# Patient Record
Sex: Male | Born: 1940 | Race: White | Hispanic: No | Marital: Married | Smoking: Current every day smoker
Health system: Southern US, Community
[De-identification: ages and names within clinical notes are randomized; demographics above are authoritative.]

## PROBLEM LIST (undated history)

## (undated) DIAGNOSIS — R001 Bradycardia, unspecified: Secondary | ICD-10-CM

## (undated) DIAGNOSIS — M545 Low back pain, unspecified: Secondary | ICD-10-CM

## (undated) DIAGNOSIS — M1990 Unspecified osteoarthritis, unspecified site: Secondary | ICD-10-CM

## (undated) DIAGNOSIS — K219 Gastro-esophageal reflux disease without esophagitis: Secondary | ICD-10-CM

## (undated) DIAGNOSIS — E785 Hyperlipidemia, unspecified: Secondary | ICD-10-CM

## (undated) DIAGNOSIS — N400 Enlarged prostate without lower urinary tract symptoms: Secondary | ICD-10-CM

## (undated) DIAGNOSIS — G8929 Other chronic pain: Secondary | ICD-10-CM

## (undated) DIAGNOSIS — Z87442 Personal history of urinary calculi: Secondary | ICD-10-CM

## (undated) DIAGNOSIS — Z87438 Personal history of other diseases of male genital organs: Secondary | ICD-10-CM

## (undated) HISTORY — DX: Hyperlipidemia, unspecified: E78.5

## (undated) HISTORY — DX: Gastro-esophageal reflux disease without esophagitis: K21.9

## (undated) HISTORY — DX: Personal history of other diseases of male genital organs: Z87.438

---

## 1987-04-28 HISTORY — PX: CYSTOSCOPY W/ STONE MANIPULATION: SHX1427

## 2008-12-18 ENCOUNTER — Ambulatory Visit (HOSPITAL_COMMUNITY): Admission: RE | Admit: 2008-12-18 | Discharge: 2008-12-18 | Payer: Self-pay | Admitting: *Deleted

## 2011-05-07 DIAGNOSIS — E782 Mixed hyperlipidemia: Secondary | ICD-10-CM | POA: Diagnosis not present

## 2011-05-07 DIAGNOSIS — K219 Gastro-esophageal reflux disease without esophagitis: Secondary | ICD-10-CM | POA: Diagnosis not present

## 2011-05-12 DIAGNOSIS — Z23 Encounter for immunization: Secondary | ICD-10-CM | POA: Diagnosis not present

## 2011-05-19 DIAGNOSIS — R972 Elevated prostate specific antigen [PSA]: Secondary | ICD-10-CM | POA: Diagnosis not present

## 2011-08-01 DIAGNOSIS — Z79899 Other long term (current) drug therapy: Secondary | ICD-10-CM | POA: Diagnosis not present

## 2011-08-01 DIAGNOSIS — F17200 Nicotine dependence, unspecified, uncomplicated: Secondary | ICD-10-CM | POA: Diagnosis not present

## 2011-08-01 DIAGNOSIS — R509 Fever, unspecified: Secondary | ICD-10-CM | POA: Diagnosis not present

## 2011-08-01 DIAGNOSIS — M549 Dorsalgia, unspecified: Secondary | ICD-10-CM | POA: Diagnosis not present

## 2011-08-01 DIAGNOSIS — N390 Urinary tract infection, site not specified: Secondary | ICD-10-CM | POA: Diagnosis not present

## 2011-08-01 DIAGNOSIS — R42 Dizziness and giddiness: Secondary | ICD-10-CM | POA: Diagnosis not present

## 2011-08-06 DIAGNOSIS — N410 Acute prostatitis: Secondary | ICD-10-CM | POA: Diagnosis not present

## 2011-11-11 DIAGNOSIS — R972 Elevated prostate specific antigen [PSA]: Secondary | ICD-10-CM | POA: Diagnosis not present

## 2011-11-16 DIAGNOSIS — N400 Enlarged prostate without lower urinary tract symptoms: Secondary | ICD-10-CM | POA: Diagnosis not present

## 2011-12-21 DIAGNOSIS — Z125 Encounter for screening for malignant neoplasm of prostate: Secondary | ICD-10-CM | POA: Diagnosis not present

## 2011-12-21 DIAGNOSIS — N400 Enlarged prostate without lower urinary tract symptoms: Secondary | ICD-10-CM | POA: Diagnosis not present

## 2011-12-21 DIAGNOSIS — E782 Mixed hyperlipidemia: Secondary | ICD-10-CM | POA: Diagnosis not present

## 2011-12-29 DIAGNOSIS — N400 Enlarged prostate without lower urinary tract symptoms: Secondary | ICD-10-CM | POA: Diagnosis not present

## 2011-12-29 DIAGNOSIS — R972 Elevated prostate specific antigen [PSA]: Secondary | ICD-10-CM | POA: Diagnosis not present

## 2012-01-26 DIAGNOSIS — R972 Elevated prostate specific antigen [PSA]: Secondary | ICD-10-CM | POA: Diagnosis not present

## 2012-01-29 DIAGNOSIS — M25569 Pain in unspecified knee: Secondary | ICD-10-CM | POA: Diagnosis not present

## 2012-02-01 DIAGNOSIS — N400 Enlarged prostate without lower urinary tract symptoms: Secondary | ICD-10-CM | POA: Diagnosis not present

## 2012-02-01 DIAGNOSIS — N529 Male erectile dysfunction, unspecified: Secondary | ICD-10-CM | POA: Diagnosis not present

## 2012-02-01 DIAGNOSIS — R972 Elevated prostate specific antigen [PSA]: Secondary | ICD-10-CM | POA: Diagnosis not present

## 2012-03-11 DIAGNOSIS — M25569 Pain in unspecified knee: Secondary | ICD-10-CM | POA: Diagnosis not present

## 2012-04-29 DIAGNOSIS — M25519 Pain in unspecified shoulder: Secondary | ICD-10-CM | POA: Diagnosis not present

## 2012-05-04 DIAGNOSIS — M75100 Unspecified rotator cuff tear or rupture of unspecified shoulder, not specified as traumatic: Secondary | ICD-10-CM | POA: Diagnosis not present

## 2012-05-04 DIAGNOSIS — M6250 Muscle wasting and atrophy, not elsewhere classified, unspecified site: Secondary | ICD-10-CM | POA: Diagnosis not present

## 2012-05-19 DIAGNOSIS — E782 Mixed hyperlipidemia: Secondary | ICD-10-CM | POA: Diagnosis not present

## 2012-06-29 DIAGNOSIS — N411 Chronic prostatitis: Secondary | ICD-10-CM | POA: Diagnosis not present

## 2012-06-29 DIAGNOSIS — M5126 Other intervertebral disc displacement, lumbar region: Secondary | ICD-10-CM | POA: Diagnosis not present

## 2012-06-29 DIAGNOSIS — R972 Elevated prostate specific antigen [PSA]: Secondary | ICD-10-CM | POA: Diagnosis not present

## 2012-06-29 DIAGNOSIS — R351 Nocturia: Secondary | ICD-10-CM | POA: Diagnosis not present

## 2012-06-29 DIAGNOSIS — M48061 Spinal stenosis, lumbar region without neurogenic claudication: Secondary | ICD-10-CM | POA: Diagnosis not present

## 2012-06-29 DIAGNOSIS — R3915 Urgency of urination: Secondary | ICD-10-CM | POA: Diagnosis not present

## 2012-07-12 DIAGNOSIS — M47817 Spondylosis without myelopathy or radiculopathy, lumbosacral region: Secondary | ICD-10-CM | POA: Diagnosis not present

## 2012-07-12 DIAGNOSIS — IMO0002 Reserved for concepts with insufficient information to code with codable children: Secondary | ICD-10-CM | POA: Diagnosis not present

## 2012-07-15 DIAGNOSIS — M5126 Other intervertebral disc displacement, lumbar region: Secondary | ICD-10-CM | POA: Diagnosis not present

## 2012-07-15 DIAGNOSIS — IMO0002 Reserved for concepts with insufficient information to code with codable children: Secondary | ICD-10-CM | POA: Diagnosis not present

## 2012-07-29 DIAGNOSIS — R972 Elevated prostate specific antigen [PSA]: Secondary | ICD-10-CM | POA: Diagnosis not present

## 2012-07-29 DIAGNOSIS — N411 Chronic prostatitis: Secondary | ICD-10-CM | POA: Diagnosis not present

## 2012-07-29 DIAGNOSIS — R3915 Urgency of urination: Secondary | ICD-10-CM | POA: Diagnosis not present

## 2012-08-01 DIAGNOSIS — R3915 Urgency of urination: Secondary | ICD-10-CM | POA: Diagnosis not present

## 2012-08-01 DIAGNOSIS — R351 Nocturia: Secondary | ICD-10-CM | POA: Diagnosis not present

## 2012-08-01 DIAGNOSIS — R972 Elevated prostate specific antigen [PSA]: Secondary | ICD-10-CM | POA: Diagnosis not present

## 2012-08-01 DIAGNOSIS — N411 Chronic prostatitis: Secondary | ICD-10-CM | POA: Diagnosis not present

## 2012-08-02 DIAGNOSIS — M5126 Other intervertebral disc displacement, lumbar region: Secondary | ICD-10-CM | POA: Diagnosis not present

## 2012-08-02 DIAGNOSIS — IMO0002 Reserved for concepts with insufficient information to code with codable children: Secondary | ICD-10-CM | POA: Diagnosis not present

## 2012-08-05 DIAGNOSIS — IMO0002 Reserved for concepts with insufficient information to code with codable children: Secondary | ICD-10-CM | POA: Diagnosis not present

## 2012-08-05 DIAGNOSIS — M5126 Other intervertebral disc displacement, lumbar region: Secondary | ICD-10-CM | POA: Diagnosis not present

## 2012-08-18 DIAGNOSIS — Z0000 Encounter for general adult medical examination without abnormal findings: Secondary | ICD-10-CM | POA: Diagnosis not present

## 2012-08-18 DIAGNOSIS — M549 Dorsalgia, unspecified: Secondary | ICD-10-CM | POA: Diagnosis not present

## 2012-08-25 DIAGNOSIS — R972 Elevated prostate specific antigen [PSA]: Secondary | ICD-10-CM | POA: Diagnosis not present

## 2012-08-30 DIAGNOSIS — IMO0002 Reserved for concepts with insufficient information to code with codable children: Secondary | ICD-10-CM | POA: Diagnosis not present

## 2012-08-30 DIAGNOSIS — M5126 Other intervertebral disc displacement, lumbar region: Secondary | ICD-10-CM | POA: Diagnosis not present

## 2012-08-31 DIAGNOSIS — N411 Chronic prostatitis: Secondary | ICD-10-CM | POA: Diagnosis not present

## 2012-08-31 DIAGNOSIS — R972 Elevated prostate specific antigen [PSA]: Secondary | ICD-10-CM | POA: Diagnosis not present

## 2012-08-31 DIAGNOSIS — R3915 Urgency of urination: Secondary | ICD-10-CM | POA: Diagnosis not present

## 2012-08-31 DIAGNOSIS — R351 Nocturia: Secondary | ICD-10-CM | POA: Diagnosis not present

## 2012-09-02 DIAGNOSIS — M79609 Pain in unspecified limb: Secondary | ICD-10-CM | POA: Diagnosis not present

## 2012-09-02 DIAGNOSIS — M5126 Other intervertebral disc displacement, lumbar region: Secondary | ICD-10-CM | POA: Diagnosis not present

## 2012-09-02 DIAGNOSIS — M2560 Stiffness of unspecified joint, not elsewhere classified: Secondary | ICD-10-CM | POA: Diagnosis not present

## 2012-09-02 DIAGNOSIS — M6250 Muscle wasting and atrophy, not elsewhere classified, unspecified site: Secondary | ICD-10-CM | POA: Diagnosis not present

## 2012-09-05 DIAGNOSIS — M79609 Pain in unspecified limb: Secondary | ICD-10-CM | POA: Diagnosis not present

## 2012-09-05 DIAGNOSIS — M6250 Muscle wasting and atrophy, not elsewhere classified, unspecified site: Secondary | ICD-10-CM | POA: Diagnosis not present

## 2012-09-05 DIAGNOSIS — M5126 Other intervertebral disc displacement, lumbar region: Secondary | ICD-10-CM | POA: Diagnosis not present

## 2012-09-05 DIAGNOSIS — M2560 Stiffness of unspecified joint, not elsewhere classified: Secondary | ICD-10-CM | POA: Diagnosis not present

## 2012-09-08 DIAGNOSIS — M5126 Other intervertebral disc displacement, lumbar region: Secondary | ICD-10-CM | POA: Diagnosis not present

## 2012-09-08 DIAGNOSIS — M2560 Stiffness of unspecified joint, not elsewhere classified: Secondary | ICD-10-CM | POA: Diagnosis not present

## 2012-09-08 DIAGNOSIS — M6250 Muscle wasting and atrophy, not elsewhere classified, unspecified site: Secondary | ICD-10-CM | POA: Diagnosis not present

## 2012-09-08 DIAGNOSIS — M79609 Pain in unspecified limb: Secondary | ICD-10-CM | POA: Diagnosis not present

## 2012-09-16 DIAGNOSIS — M5126 Other intervertebral disc displacement, lumbar region: Secondary | ICD-10-CM | POA: Diagnosis not present

## 2012-09-16 DIAGNOSIS — M79609 Pain in unspecified limb: Secondary | ICD-10-CM | POA: Diagnosis not present

## 2012-09-16 DIAGNOSIS — M6250 Muscle wasting and atrophy, not elsewhere classified, unspecified site: Secondary | ICD-10-CM | POA: Diagnosis not present

## 2012-09-16 DIAGNOSIS — M2560 Stiffness of unspecified joint, not elsewhere classified: Secondary | ICD-10-CM | POA: Diagnosis not present

## 2012-09-23 DIAGNOSIS — M2560 Stiffness of unspecified joint, not elsewhere classified: Secondary | ICD-10-CM | POA: Diagnosis not present

## 2012-09-23 DIAGNOSIS — M79609 Pain in unspecified limb: Secondary | ICD-10-CM | POA: Diagnosis not present

## 2012-09-23 DIAGNOSIS — M5126 Other intervertebral disc displacement, lumbar region: Secondary | ICD-10-CM | POA: Diagnosis not present

## 2012-09-23 DIAGNOSIS — M6250 Muscle wasting and atrophy, not elsewhere classified, unspecified site: Secondary | ICD-10-CM | POA: Diagnosis not present

## 2012-09-26 DIAGNOSIS — M6250 Muscle wasting and atrophy, not elsewhere classified, unspecified site: Secondary | ICD-10-CM | POA: Diagnosis not present

## 2012-09-26 DIAGNOSIS — M2560 Stiffness of unspecified joint, not elsewhere classified: Secondary | ICD-10-CM | POA: Diagnosis not present

## 2012-09-26 DIAGNOSIS — M79609 Pain in unspecified limb: Secondary | ICD-10-CM | POA: Diagnosis not present

## 2012-09-26 DIAGNOSIS — M5126 Other intervertebral disc displacement, lumbar region: Secondary | ICD-10-CM | POA: Diagnosis not present

## 2012-09-29 DIAGNOSIS — M6250 Muscle wasting and atrophy, not elsewhere classified, unspecified site: Secondary | ICD-10-CM | POA: Diagnosis not present

## 2012-09-29 DIAGNOSIS — M5126 Other intervertebral disc displacement, lumbar region: Secondary | ICD-10-CM | POA: Diagnosis not present

## 2012-09-29 DIAGNOSIS — M79609 Pain in unspecified limb: Secondary | ICD-10-CM | POA: Diagnosis not present

## 2012-09-29 DIAGNOSIS — M2560 Stiffness of unspecified joint, not elsewhere classified: Secondary | ICD-10-CM | POA: Diagnosis not present

## 2012-10-03 DIAGNOSIS — M2560 Stiffness of unspecified joint, not elsewhere classified: Secondary | ICD-10-CM | POA: Diagnosis not present

## 2012-10-03 DIAGNOSIS — M6250 Muscle wasting and atrophy, not elsewhere classified, unspecified site: Secondary | ICD-10-CM | POA: Diagnosis not present

## 2012-10-03 DIAGNOSIS — M79609 Pain in unspecified limb: Secondary | ICD-10-CM | POA: Diagnosis not present

## 2012-10-03 DIAGNOSIS — M5126 Other intervertebral disc displacement, lumbar region: Secondary | ICD-10-CM | POA: Diagnosis not present

## 2012-10-06 DIAGNOSIS — M6250 Muscle wasting and atrophy, not elsewhere classified, unspecified site: Secondary | ICD-10-CM | POA: Diagnosis not present

## 2012-10-06 DIAGNOSIS — M2560 Stiffness of unspecified joint, not elsewhere classified: Secondary | ICD-10-CM | POA: Diagnosis not present

## 2012-10-06 DIAGNOSIS — M5126 Other intervertebral disc displacement, lumbar region: Secondary | ICD-10-CM | POA: Diagnosis not present

## 2012-10-06 DIAGNOSIS — M79609 Pain in unspecified limb: Secondary | ICD-10-CM | POA: Diagnosis not present

## 2012-10-10 DIAGNOSIS — M2560 Stiffness of unspecified joint, not elsewhere classified: Secondary | ICD-10-CM | POA: Diagnosis not present

## 2012-10-10 DIAGNOSIS — M6250 Muscle wasting and atrophy, not elsewhere classified, unspecified site: Secondary | ICD-10-CM | POA: Diagnosis not present

## 2012-10-10 DIAGNOSIS — M79609 Pain in unspecified limb: Secondary | ICD-10-CM | POA: Diagnosis not present

## 2012-10-10 DIAGNOSIS — M5126 Other intervertebral disc displacement, lumbar region: Secondary | ICD-10-CM | POA: Diagnosis not present

## 2012-10-11 DIAGNOSIS — M47817 Spondylosis without myelopathy or radiculopathy, lumbosacral region: Secondary | ICD-10-CM | POA: Diagnosis not present

## 2012-10-11 DIAGNOSIS — M5126 Other intervertebral disc displacement, lumbar region: Secondary | ICD-10-CM | POA: Diagnosis not present

## 2012-10-14 DIAGNOSIS — M2560 Stiffness of unspecified joint, not elsewhere classified: Secondary | ICD-10-CM | POA: Diagnosis not present

## 2012-10-14 DIAGNOSIS — M6250 Muscle wasting and atrophy, not elsewhere classified, unspecified site: Secondary | ICD-10-CM | POA: Diagnosis not present

## 2012-10-14 DIAGNOSIS — M79609 Pain in unspecified limb: Secondary | ICD-10-CM | POA: Diagnosis not present

## 2012-10-14 DIAGNOSIS — M5126 Other intervertebral disc displacement, lumbar region: Secondary | ICD-10-CM | POA: Diagnosis not present

## 2012-10-17 DIAGNOSIS — M5126 Other intervertebral disc displacement, lumbar region: Secondary | ICD-10-CM | POA: Diagnosis not present

## 2012-10-17 DIAGNOSIS — M6250 Muscle wasting and atrophy, not elsewhere classified, unspecified site: Secondary | ICD-10-CM | POA: Diagnosis not present

## 2012-10-17 DIAGNOSIS — M79609 Pain in unspecified limb: Secondary | ICD-10-CM | POA: Diagnosis not present

## 2012-10-17 DIAGNOSIS — M2560 Stiffness of unspecified joint, not elsewhere classified: Secondary | ICD-10-CM | POA: Diagnosis not present

## 2012-10-21 DIAGNOSIS — M79609 Pain in unspecified limb: Secondary | ICD-10-CM | POA: Diagnosis not present

## 2012-10-21 DIAGNOSIS — M2560 Stiffness of unspecified joint, not elsewhere classified: Secondary | ICD-10-CM | POA: Diagnosis not present

## 2012-10-21 DIAGNOSIS — M6250 Muscle wasting and atrophy, not elsewhere classified, unspecified site: Secondary | ICD-10-CM | POA: Diagnosis not present

## 2012-10-21 DIAGNOSIS — M5126 Other intervertebral disc displacement, lumbar region: Secondary | ICD-10-CM | POA: Diagnosis not present

## 2012-10-24 DIAGNOSIS — M2560 Stiffness of unspecified joint, not elsewhere classified: Secondary | ICD-10-CM | POA: Diagnosis not present

## 2012-10-24 DIAGNOSIS — M6250 Muscle wasting and atrophy, not elsewhere classified, unspecified site: Secondary | ICD-10-CM | POA: Diagnosis not present

## 2012-10-24 DIAGNOSIS — M79609 Pain in unspecified limb: Secondary | ICD-10-CM | POA: Diagnosis not present

## 2012-10-24 DIAGNOSIS — M5126 Other intervertebral disc displacement, lumbar region: Secondary | ICD-10-CM | POA: Diagnosis not present

## 2012-10-27 DIAGNOSIS — M6250 Muscle wasting and atrophy, not elsewhere classified, unspecified site: Secondary | ICD-10-CM | POA: Diagnosis not present

## 2012-10-27 DIAGNOSIS — M2560 Stiffness of unspecified joint, not elsewhere classified: Secondary | ICD-10-CM | POA: Diagnosis not present

## 2012-10-27 DIAGNOSIS — M5126 Other intervertebral disc displacement, lumbar region: Secondary | ICD-10-CM | POA: Diagnosis not present

## 2012-10-27 DIAGNOSIS — M79609 Pain in unspecified limb: Secondary | ICD-10-CM | POA: Diagnosis not present

## 2012-10-31 DIAGNOSIS — M79609 Pain in unspecified limb: Secondary | ICD-10-CM | POA: Diagnosis not present

## 2012-10-31 DIAGNOSIS — M2560 Stiffness of unspecified joint, not elsewhere classified: Secondary | ICD-10-CM | POA: Diagnosis not present

## 2012-10-31 DIAGNOSIS — M5126 Other intervertebral disc displacement, lumbar region: Secondary | ICD-10-CM | POA: Diagnosis not present

## 2012-10-31 DIAGNOSIS — M6250 Muscle wasting and atrophy, not elsewhere classified, unspecified site: Secondary | ICD-10-CM | POA: Diagnosis not present

## 2012-11-07 DIAGNOSIS — M5126 Other intervertebral disc displacement, lumbar region: Secondary | ICD-10-CM | POA: Diagnosis not present

## 2012-11-07 DIAGNOSIS — M2560 Stiffness of unspecified joint, not elsewhere classified: Secondary | ICD-10-CM | POA: Diagnosis not present

## 2012-11-07 DIAGNOSIS — M6250 Muscle wasting and atrophy, not elsewhere classified, unspecified site: Secondary | ICD-10-CM | POA: Diagnosis not present

## 2012-11-07 DIAGNOSIS — M79609 Pain in unspecified limb: Secondary | ICD-10-CM | POA: Diagnosis not present

## 2012-11-10 DIAGNOSIS — M6250 Muscle wasting and atrophy, not elsewhere classified, unspecified site: Secondary | ICD-10-CM | POA: Diagnosis not present

## 2012-11-10 DIAGNOSIS — M5126 Other intervertebral disc displacement, lumbar region: Secondary | ICD-10-CM | POA: Diagnosis not present

## 2012-11-10 DIAGNOSIS — M2560 Stiffness of unspecified joint, not elsewhere classified: Secondary | ICD-10-CM | POA: Diagnosis not present

## 2012-11-10 DIAGNOSIS — M79609 Pain in unspecified limb: Secondary | ICD-10-CM | POA: Diagnosis not present

## 2012-11-14 DIAGNOSIS — M2560 Stiffness of unspecified joint, not elsewhere classified: Secondary | ICD-10-CM | POA: Diagnosis not present

## 2012-11-14 DIAGNOSIS — M5126 Other intervertebral disc displacement, lumbar region: Secondary | ICD-10-CM | POA: Diagnosis not present

## 2012-11-14 DIAGNOSIS — M79609 Pain in unspecified limb: Secondary | ICD-10-CM | POA: Diagnosis not present

## 2012-11-14 DIAGNOSIS — M6250 Muscle wasting and atrophy, not elsewhere classified, unspecified site: Secondary | ICD-10-CM | POA: Diagnosis not present

## 2012-11-15 DIAGNOSIS — M5126 Other intervertebral disc displacement, lumbar region: Secondary | ICD-10-CM | POA: Diagnosis not present

## 2012-11-15 DIAGNOSIS — IMO0002 Reserved for concepts with insufficient information to code with codable children: Secondary | ICD-10-CM | POA: Diagnosis not present

## 2012-11-17 DIAGNOSIS — E782 Mixed hyperlipidemia: Secondary | ICD-10-CM | POA: Diagnosis not present

## 2012-11-18 DIAGNOSIS — M6250 Muscle wasting and atrophy, not elsewhere classified, unspecified site: Secondary | ICD-10-CM | POA: Diagnosis not present

## 2012-11-18 DIAGNOSIS — M5126 Other intervertebral disc displacement, lumbar region: Secondary | ICD-10-CM | POA: Diagnosis not present

## 2012-11-18 DIAGNOSIS — M2560 Stiffness of unspecified joint, not elsewhere classified: Secondary | ICD-10-CM | POA: Diagnosis not present

## 2012-11-18 DIAGNOSIS — M79609 Pain in unspecified limb: Secondary | ICD-10-CM | POA: Diagnosis not present

## 2013-01-31 DIAGNOSIS — R972 Elevated prostate specific antigen [PSA]: Secondary | ICD-10-CM | POA: Diagnosis not present

## 2013-02-07 DIAGNOSIS — R972 Elevated prostate specific antigen [PSA]: Secondary | ICD-10-CM | POA: Diagnosis not present

## 2013-02-07 DIAGNOSIS — R3915 Urgency of urination: Secondary | ICD-10-CM | POA: Diagnosis not present

## 2013-02-07 DIAGNOSIS — N411 Chronic prostatitis: Secondary | ICD-10-CM | POA: Diagnosis not present

## 2013-02-07 DIAGNOSIS — R351 Nocturia: Secondary | ICD-10-CM | POA: Diagnosis not present

## 2013-02-17 DIAGNOSIS — E782 Mixed hyperlipidemia: Secondary | ICD-10-CM | POA: Diagnosis not present

## 2013-02-23 DIAGNOSIS — Z23 Encounter for immunization: Secondary | ICD-10-CM | POA: Diagnosis not present

## 2013-06-20 DIAGNOSIS — Z79899 Other long term (current) drug therapy: Secondary | ICD-10-CM | POA: Diagnosis not present

## 2013-06-20 DIAGNOSIS — E782 Mixed hyperlipidemia: Secondary | ICD-10-CM | POA: Diagnosis not present

## 2013-08-03 DIAGNOSIS — R972 Elevated prostate specific antigen [PSA]: Secondary | ICD-10-CM | POA: Diagnosis not present

## 2013-08-09 DIAGNOSIS — R3915 Urgency of urination: Secondary | ICD-10-CM | POA: Diagnosis not present

## 2013-08-09 DIAGNOSIS — R39198 Other difficulties with micturition: Secondary | ICD-10-CM | POA: Diagnosis not present

## 2013-08-09 DIAGNOSIS — R972 Elevated prostate specific antigen [PSA]: Secondary | ICD-10-CM | POA: Diagnosis not present

## 2013-09-19 DIAGNOSIS — E782 Mixed hyperlipidemia: Secondary | ICD-10-CM | POA: Diagnosis not present

## 2013-09-19 DIAGNOSIS — Z0000 Encounter for general adult medical examination without abnormal findings: Secondary | ICD-10-CM | POA: Diagnosis not present

## 2013-12-19 DIAGNOSIS — E782 Mixed hyperlipidemia: Secondary | ICD-10-CM | POA: Diagnosis not present

## 2014-01-04 DIAGNOSIS — E782 Mixed hyperlipidemia: Secondary | ICD-10-CM | POA: Diagnosis not present

## 2014-02-06 DIAGNOSIS — R972 Elevated prostate specific antigen [PSA]: Secondary | ICD-10-CM | POA: Diagnosis not present

## 2014-02-12 DIAGNOSIS — Z87438 Personal history of other diseases of male genital organs: Secondary | ICD-10-CM | POA: Diagnosis not present

## 2014-02-12 DIAGNOSIS — N529 Male erectile dysfunction, unspecified: Secondary | ICD-10-CM | POA: Diagnosis not present

## 2014-02-12 DIAGNOSIS — R3919 Other difficulties with micturition: Secondary | ICD-10-CM | POA: Diagnosis not present

## 2014-02-12 DIAGNOSIS — R972 Elevated prostate specific antigen [PSA]: Secondary | ICD-10-CM | POA: Diagnosis not present

## 2014-02-16 DIAGNOSIS — Z23 Encounter for immunization: Secondary | ICD-10-CM | POA: Diagnosis not present

## 2014-03-13 DIAGNOSIS — R972 Elevated prostate specific antigen [PSA]: Secondary | ICD-10-CM | POA: Diagnosis not present

## 2014-03-19 DIAGNOSIS — E784 Other hyperlipidemia: Secondary | ICD-10-CM | POA: Diagnosis not present

## 2014-03-19 DIAGNOSIS — N401 Enlarged prostate with lower urinary tract symptoms: Secondary | ICD-10-CM | POA: Diagnosis not present

## 2014-03-19 DIAGNOSIS — R3919 Other difficulties with micturition: Secondary | ICD-10-CM | POA: Diagnosis not present

## 2014-03-19 DIAGNOSIS — R972 Elevated prostate specific antigen [PSA]: Secondary | ICD-10-CM | POA: Diagnosis not present

## 2014-03-19 DIAGNOSIS — Z87438 Personal history of other diseases of male genital organs: Secondary | ICD-10-CM | POA: Diagnosis not present

## 2014-05-07 DIAGNOSIS — H2513 Age-related nuclear cataract, bilateral: Secondary | ICD-10-CM | POA: Diagnosis not present

## 2014-05-07 DIAGNOSIS — H35371 Puckering of macula, right eye: Secondary | ICD-10-CM | POA: Diagnosis not present

## 2014-05-07 DIAGNOSIS — Z720 Tobacco use: Secondary | ICD-10-CM | POA: Diagnosis not present

## 2014-05-31 DIAGNOSIS — H35371 Puckering of macula, right eye: Secondary | ICD-10-CM | POA: Diagnosis not present

## 2014-06-21 DIAGNOSIS — E784 Other hyperlipidemia: Secondary | ICD-10-CM | POA: Diagnosis not present

## 2014-06-21 DIAGNOSIS — E782 Mixed hyperlipidemia: Secondary | ICD-10-CM | POA: Diagnosis not present

## 2014-08-30 DIAGNOSIS — H35371 Puckering of macula, right eye: Secondary | ICD-10-CM | POA: Diagnosis not present

## 2014-09-14 DIAGNOSIS — R972 Elevated prostate specific antigen [PSA]: Secondary | ICD-10-CM | POA: Diagnosis not present

## 2014-09-25 DIAGNOSIS — R972 Elevated prostate specific antigen [PSA]: Secondary | ICD-10-CM | POA: Diagnosis not present

## 2014-09-25 DIAGNOSIS — Z87438 Personal history of other diseases of male genital organs: Secondary | ICD-10-CM | POA: Diagnosis not present

## 2014-10-01 DIAGNOSIS — E784 Other hyperlipidemia: Secondary | ICD-10-CM | POA: Diagnosis not present

## 2014-10-01 DIAGNOSIS — Z0000 Encounter for general adult medical examination without abnormal findings: Secondary | ICD-10-CM | POA: Diagnosis not present

## 2014-10-01 DIAGNOSIS — M549 Dorsalgia, unspecified: Secondary | ICD-10-CM | POA: Diagnosis not present

## 2014-10-01 DIAGNOSIS — K210 Gastro-esophageal reflux disease with esophagitis: Secondary | ICD-10-CM | POA: Diagnosis not present

## 2014-10-01 DIAGNOSIS — M8588 Other specified disorders of bone density and structure, other site: Secondary | ICD-10-CM | POA: Diagnosis not present

## 2014-10-01 DIAGNOSIS — R05 Cough: Secondary | ICD-10-CM | POA: Diagnosis not present

## 2014-10-01 DIAGNOSIS — M47814 Spondylosis without myelopathy or radiculopathy, thoracic region: Secondary | ICD-10-CM | POA: Diagnosis not present

## 2014-10-01 DIAGNOSIS — Z1389 Encounter for screening for other disorder: Secondary | ICD-10-CM | POA: Diagnosis not present

## 2014-10-01 DIAGNOSIS — F17200 Nicotine dependence, unspecified, uncomplicated: Secondary | ICD-10-CM | POA: Diagnosis not present

## 2014-10-26 DIAGNOSIS — Z87438 Personal history of other diseases of male genital organs: Secondary | ICD-10-CM

## 2014-10-26 HISTORY — PX: PROSTATE BIOPSY: SHX241

## 2014-10-26 HISTORY — DX: Personal history of other diseases of male genital organs: Z87.438

## 2014-11-06 DIAGNOSIS — E869 Volume depletion, unspecified: Secondary | ICD-10-CM | POA: Diagnosis present

## 2014-11-06 DIAGNOSIS — B9620 Unspecified Escherichia coli [E. coli] as the cause of diseases classified elsewhere: Secondary | ICD-10-CM | POA: Diagnosis present

## 2014-11-06 DIAGNOSIS — R972 Elevated prostate specific antigen [PSA]: Secondary | ICD-10-CM | POA: Diagnosis not present

## 2014-11-06 DIAGNOSIS — N390 Urinary tract infection, site not specified: Secondary | ICD-10-CM | POA: Diagnosis not present

## 2014-11-06 DIAGNOSIS — A4151 Sepsis due to Escherichia coli [E. coli]: Secondary | ICD-10-CM | POA: Diagnosis not present

## 2014-11-06 DIAGNOSIS — K219 Gastro-esophageal reflux disease without esophagitis: Secondary | ICD-10-CM | POA: Diagnosis not present

## 2014-11-06 DIAGNOSIS — N410 Acute prostatitis: Secondary | ICD-10-CM | POA: Diagnosis not present

## 2014-11-06 DIAGNOSIS — F17200 Nicotine dependence, unspecified, uncomplicated: Secondary | ICD-10-CM | POA: Diagnosis not present

## 2014-11-06 DIAGNOSIS — D291 Benign neoplasm of prostate: Secondary | ICD-10-CM | POA: Diagnosis not present

## 2014-11-06 DIAGNOSIS — R0602 Shortness of breath: Secondary | ICD-10-CM | POA: Diagnosis not present

## 2014-11-06 DIAGNOSIS — D638 Anemia in other chronic diseases classified elsewhere: Secondary | ICD-10-CM | POA: Diagnosis not present

## 2014-11-06 DIAGNOSIS — E860 Dehydration: Secondary | ICD-10-CM | POA: Diagnosis not present

## 2014-11-06 DIAGNOSIS — N182 Chronic kidney disease, stage 2 (mild): Secondary | ICD-10-CM | POA: Diagnosis present

## 2014-11-06 DIAGNOSIS — N289 Disorder of kidney and ureter, unspecified: Secondary | ICD-10-CM | POA: Diagnosis present

## 2014-11-06 DIAGNOSIS — E780 Pure hypercholesterolemia: Secondary | ICD-10-CM | POA: Diagnosis present

## 2014-11-06 DIAGNOSIS — E785 Hyperlipidemia, unspecified: Secondary | ICD-10-CM | POA: Diagnosis not present

## 2014-11-06 DIAGNOSIS — M549 Dorsalgia, unspecified: Secondary | ICD-10-CM | POA: Diagnosis not present

## 2014-11-08 DIAGNOSIS — N410 Acute prostatitis: Secondary | ICD-10-CM | POA: Diagnosis not present

## 2014-11-08 DIAGNOSIS — M549 Dorsalgia, unspecified: Secondary | ICD-10-CM | POA: Diagnosis not present

## 2014-11-08 DIAGNOSIS — N390 Urinary tract infection, site not specified: Secondary | ICD-10-CM | POA: Diagnosis not present

## 2014-11-13 DIAGNOSIS — N179 Acute kidney failure, unspecified: Secondary | ICD-10-CM | POA: Diagnosis not present

## 2014-11-13 DIAGNOSIS — N400 Enlarged prostate without lower urinary tract symptoms: Secondary | ICD-10-CM | POA: Diagnosis not present

## 2014-11-13 DIAGNOSIS — N390 Urinary tract infection, site not specified: Secondary | ICD-10-CM | POA: Diagnosis not present

## 2014-11-13 DIAGNOSIS — F17210 Nicotine dependence, cigarettes, uncomplicated: Secondary | ICD-10-CM | POA: Diagnosis not present

## 2014-11-13 DIAGNOSIS — N410 Acute prostatitis: Secondary | ICD-10-CM | POA: Diagnosis not present

## 2014-11-14 DIAGNOSIS — F17210 Nicotine dependence, cigarettes, uncomplicated: Secondary | ICD-10-CM | POA: Diagnosis not present

## 2014-11-14 DIAGNOSIS — N179 Acute kidney failure, unspecified: Secondary | ICD-10-CM | POA: Diagnosis not present

## 2014-11-14 DIAGNOSIS — N390 Urinary tract infection, site not specified: Secondary | ICD-10-CM | POA: Diagnosis not present

## 2014-11-14 DIAGNOSIS — N400 Enlarged prostate without lower urinary tract symptoms: Secondary | ICD-10-CM | POA: Diagnosis not present

## 2014-11-14 DIAGNOSIS — N410 Acute prostatitis: Secondary | ICD-10-CM | POA: Diagnosis not present

## 2014-11-15 DIAGNOSIS — N390 Urinary tract infection, site not specified: Secondary | ICD-10-CM | POA: Diagnosis not present

## 2014-11-15 DIAGNOSIS — N410 Acute prostatitis: Secondary | ICD-10-CM | POA: Diagnosis not present

## 2014-11-15 DIAGNOSIS — F17210 Nicotine dependence, cigarettes, uncomplicated: Secondary | ICD-10-CM | POA: Diagnosis not present

## 2014-11-15 DIAGNOSIS — N179 Acute kidney failure, unspecified: Secondary | ICD-10-CM | POA: Diagnosis not present

## 2014-11-15 DIAGNOSIS — N400 Enlarged prostate without lower urinary tract symptoms: Secondary | ICD-10-CM | POA: Diagnosis not present

## 2014-11-16 DIAGNOSIS — N179 Acute kidney failure, unspecified: Secondary | ICD-10-CM | POA: Diagnosis not present

## 2014-11-16 DIAGNOSIS — N410 Acute prostatitis: Secondary | ICD-10-CM | POA: Diagnosis not present

## 2014-11-16 DIAGNOSIS — N390 Urinary tract infection, site not specified: Secondary | ICD-10-CM | POA: Diagnosis not present

## 2014-11-16 DIAGNOSIS — N400 Enlarged prostate without lower urinary tract symptoms: Secondary | ICD-10-CM | POA: Diagnosis not present

## 2014-11-16 DIAGNOSIS — F17210 Nicotine dependence, cigarettes, uncomplicated: Secondary | ICD-10-CM | POA: Diagnosis not present

## 2014-11-17 DIAGNOSIS — N410 Acute prostatitis: Secondary | ICD-10-CM | POA: Diagnosis not present

## 2014-11-17 DIAGNOSIS — N179 Acute kidney failure, unspecified: Secondary | ICD-10-CM | POA: Diagnosis not present

## 2014-11-17 DIAGNOSIS — F17210 Nicotine dependence, cigarettes, uncomplicated: Secondary | ICD-10-CM | POA: Diagnosis not present

## 2014-11-17 DIAGNOSIS — N390 Urinary tract infection, site not specified: Secondary | ICD-10-CM | POA: Diagnosis not present

## 2014-11-17 DIAGNOSIS — N400 Enlarged prostate without lower urinary tract symptoms: Secondary | ICD-10-CM | POA: Diagnosis not present

## 2014-11-18 DIAGNOSIS — F17210 Nicotine dependence, cigarettes, uncomplicated: Secondary | ICD-10-CM | POA: Diagnosis not present

## 2014-11-18 DIAGNOSIS — N390 Urinary tract infection, site not specified: Secondary | ICD-10-CM | POA: Diagnosis not present

## 2014-11-18 DIAGNOSIS — N400 Enlarged prostate without lower urinary tract symptoms: Secondary | ICD-10-CM | POA: Diagnosis not present

## 2014-11-18 DIAGNOSIS — N179 Acute kidney failure, unspecified: Secondary | ICD-10-CM | POA: Diagnosis not present

## 2014-11-18 DIAGNOSIS — N410 Acute prostatitis: Secondary | ICD-10-CM | POA: Diagnosis not present

## 2014-11-19 DIAGNOSIS — N410 Acute prostatitis: Secondary | ICD-10-CM | POA: Diagnosis not present

## 2014-11-19 DIAGNOSIS — N179 Acute kidney failure, unspecified: Secondary | ICD-10-CM | POA: Diagnosis not present

## 2014-11-19 DIAGNOSIS — N400 Enlarged prostate without lower urinary tract symptoms: Secondary | ICD-10-CM | POA: Diagnosis not present

## 2014-11-19 DIAGNOSIS — R001 Bradycardia, unspecified: Secondary | ICD-10-CM | POA: Diagnosis not present

## 2014-11-19 DIAGNOSIS — N390 Urinary tract infection, site not specified: Secondary | ICD-10-CM | POA: Diagnosis not present

## 2014-11-19 DIAGNOSIS — F17210 Nicotine dependence, cigarettes, uncomplicated: Secondary | ICD-10-CM | POA: Diagnosis not present

## 2014-11-20 DIAGNOSIS — N400 Enlarged prostate without lower urinary tract symptoms: Secondary | ICD-10-CM | POA: Diagnosis not present

## 2014-11-20 DIAGNOSIS — N390 Urinary tract infection, site not specified: Secondary | ICD-10-CM | POA: Diagnosis not present

## 2014-11-20 DIAGNOSIS — N179 Acute kidney failure, unspecified: Secondary | ICD-10-CM | POA: Diagnosis not present

## 2014-11-20 DIAGNOSIS — N410 Acute prostatitis: Secondary | ICD-10-CM | POA: Diagnosis not present

## 2014-11-20 DIAGNOSIS — F17210 Nicotine dependence, cigarettes, uncomplicated: Secondary | ICD-10-CM | POA: Diagnosis not present

## 2014-11-21 DIAGNOSIS — F17210 Nicotine dependence, cigarettes, uncomplicated: Secondary | ICD-10-CM | POA: Diagnosis not present

## 2014-11-21 DIAGNOSIS — N410 Acute prostatitis: Secondary | ICD-10-CM | POA: Diagnosis not present

## 2014-11-21 DIAGNOSIS — N400 Enlarged prostate without lower urinary tract symptoms: Secondary | ICD-10-CM | POA: Diagnosis not present

## 2014-11-21 DIAGNOSIS — N390 Urinary tract infection, site not specified: Secondary | ICD-10-CM | POA: Diagnosis not present

## 2014-11-21 DIAGNOSIS — N179 Acute kidney failure, unspecified: Secondary | ICD-10-CM | POA: Diagnosis not present

## 2014-11-22 DIAGNOSIS — N410 Acute prostatitis: Secondary | ICD-10-CM | POA: Diagnosis not present

## 2014-11-22 DIAGNOSIS — N390 Urinary tract infection, site not specified: Secondary | ICD-10-CM | POA: Diagnosis not present

## 2014-11-22 DIAGNOSIS — N179 Acute kidney failure, unspecified: Secondary | ICD-10-CM | POA: Diagnosis not present

## 2014-11-22 DIAGNOSIS — F17210 Nicotine dependence, cigarettes, uncomplicated: Secondary | ICD-10-CM | POA: Diagnosis not present

## 2014-11-22 DIAGNOSIS — N400 Enlarged prostate without lower urinary tract symptoms: Secondary | ICD-10-CM | POA: Diagnosis not present

## 2014-11-26 ENCOUNTER — Encounter: Payer: Self-pay | Admitting: *Deleted

## 2014-11-27 ENCOUNTER — Encounter: Payer: Self-pay | Admitting: Cardiology

## 2014-11-27 ENCOUNTER — Ambulatory Visit (INDEPENDENT_AMBULATORY_CARE_PROVIDER_SITE_OTHER): Payer: Medicare Other | Admitting: Cardiology

## 2014-11-27 ENCOUNTER — Encounter: Payer: Self-pay | Admitting: *Deleted

## 2014-11-27 ENCOUNTER — Ambulatory Visit (INDEPENDENT_AMBULATORY_CARE_PROVIDER_SITE_OTHER): Payer: Medicare Other

## 2014-11-27 VITALS — BP 104/61 | HR 59 | Ht 74.0 in | Wt 198.0 lb

## 2014-11-27 DIAGNOSIS — E785 Hyperlipidemia, unspecified: Secondary | ICD-10-CM

## 2014-11-27 DIAGNOSIS — R001 Bradycardia, unspecified: Secondary | ICD-10-CM

## 2014-11-27 NOTE — Patient Instructions (Signed)
Your physician recommends that you schedule a follow-up appointment AFTER MONITOR RESULTS  Your physician recommends that you continue on your current medications as directed. Please refer to the Current Medication list given to you today.  Your physician has recommended that you wear a holter monitor 48 HOURS . Holter monitors are medical devices that record the heart's electrical activity. Doctors most often use these monitors to diagnose arrhythmias. Arrhythmias are problems with the speed or rhythm of the heartbeat. The monitor is a small, portable device. You can wear one while you do your normal daily activities. This is usually used to diagnose what is causing palpitations/syncope (passing out).  Thank you for choosing [facility]!!

## 2014-11-27 NOTE — Progress Notes (Signed)
Cardiology Office Note  Date: 11/27/2014   ID: Ronald May, DOB 24-Sep-1940, MRN [id number]  PCP: Neale Burly, MD  Consulting Cardiologist: Rozann Lesches, MD   Chief Complaint  Patient presents with  . Bradycardia    History of Present Illness: Ronald May is a 74 y.o. male referred for cardiology consultation by Dr. Sherrie Sport. He presents for further evaluation of bradycardia. He states that he has been told that his heart rate is "slow" over the last several months, although it is not entirely clear that this is symptom provoking. He denies having any sudden onset of syncope. Sometimes feels mildly lightheaded when he stands up quickly, but this is not a progressive or recurring problem. He remains active, works in his garden, also restores furniture, specifically repairs chair cane bottoms and backings.  Record review finds hospitalization at [facility] in July with diagnoses including acute prostatitis, dehydration, and acute on chronic renal insufficiency. He was hydrated also treated with antibiotic therapy.  He reports that his blood pressure has always been normal, sometimes low normal. He is not on any antihypertensives medications or heart rate lowering medications.  ECG is reviewed showing sinus bradycardia at 53 bpm with normal intervals and decreased R waves in the anteroseptal leads. He has no history of ischemic heart disease, myocardial infarction, or cardiac arrhythmia.   Past Medical History  Diagnosis Date  . History of prostatitis   . GERD (gastroesophageal reflux disease)   . Hyperlipidemia     Past Surgical History  Procedure Laterality Date  . Kidney stone extraction      Current Outpatient Prescriptions  Medication Sig Dispense Refill  . alfuzosin (UROXATRAL) 10 MG 24 hr tablet Take 10 mg by mouth daily with breakfast.    . calcium-vitamin D (OSCAL WITH D) 500-200 MG-UNIT per tablet Take 1 tablet by mouth daily.     . nitrofurantoin (MACRODANTIN)  100 MG capsule Take 100 mg by mouth 2 (two) times daily.    . Omega-3 Fatty Acids (FISH OIL) 1000 MG CAPS Take 1 capsule by mouth 3 (three) times daily.     Marland Kitchen omeprazole (PRILOSEC) 20 MG capsule Take 20 mg by mouth daily.    . ranitidine (ZANTAC) 300 MG tablet Take 300 mg by mouth 2 (two) times daily.    . simvastatin (ZOCOR) 40 MG tablet Take 40 mg by mouth daily.     No current facility-administered medications for this visit.    Allergies:  Review of patient's allergies indicates no known allergies.   Social History: The patient  reports that he has quit smoking. His smoking use included Cigarettes. He started smoking about 4 weeks ago. He does not have any smokeless tobacco history on file. He reports that he does not drink alcohol or use illicit drugs.   Family History: The patient's family history includes CAD in his brother; Liver cancer in his mother.   ROS:  Please see the history of present illness. Otherwise, complete review of systems is positive for arthritis in his hands.  All other systems are reviewed and negative.   Physical Exam: VS:  BP 104/61 mmHg  Pulse 59  Ht 6\' 2"  (1.88 m)  Wt 198 lb (89.812 kg)  BMI 25.41 kg/m2  SpO2 92%, BMI Body mass index is 25.41 kg/(m^2).  Wt Readings from Last 3 Encounters:  11/27/14 198 lb (89.812 kg)  11/19/14 199 lb (90.266 kg)     General: Patient appears comfortable at rest. HEENT: Conjunctiva and  lids normal, oropharynx clear. Neck: Supple, no elevated JVP or carotid bruits, no thyromegaly. Lungs: Clear to auscultation, nonlabored breathing at rest. Cardiac: Regular rate and rhythm, no S3, soft systolic murmur, no pericardial rub. Abdomen: Soft, nontender, bowel sounds present, no guarding or rebound. Extremities: No pitting edema, distal pulses 2+. Skin: Warm and dry. Musculoskeletal: No kyphosis. Neuropsychiatric: Alert and oriented x3, affect grossly appropriate.   ECG: Tracing from 11/08/2014 [facility]) showed sinus  bradycardia at 50 bpm.   Other Studies Reviewed Today:  Chest x-ray 11/10/2014 [facility]) reported bronchitic changes with increased density in the left retrocardiac region.  Assessment and Plan:  1. Sinus bradycardia, not entirely clear that this is symptom provoking. He does not endorse any syncope or major functional limitation. Occasionally has mild lightheadedness when he stands quickly. Suspect that that might be more related to blood pressure than heart rate however. Recommend 48 hour Holter monitor to better evaluate heart rate variability.  2. History of hyperlipidemia, on Zocor and omega-3 supplements. Followed by Dr. Sherrie Sport.  Current medicines were reviewed with the patient today.   Orders Placed This Encounter  Procedures  . Holter monitor - 48 hour  . EKG 12-Lead    Disposition: Call with test results.   Signed, Satira Sark, MD, [facility] 11/27/2014 9:59 AM    [facility], [place], [place] [address] Phone: [phone]; Fax: [phone]

## 2014-12-05 ENCOUNTER — Telehealth: Payer: Self-pay | Admitting: *Deleted

## 2014-12-05 NOTE — Telephone Encounter (Signed)
-----   Message from Satira Sark, MD sent at 12/05/2014  8:07 AM EDT ----- See report. Limited bradycardia noted but no pauses or significant arrhythmia. Could consider evaluation for sleep apnea per Dr. Sherrie Sport since early AM bradycardia noted, but no further cardiac workup for now.

## 2014-12-06 NOTE — Telephone Encounter (Signed)
Patient informed and copy sent to Dr. Sherrie Sport.

## 2015-02-19 DIAGNOSIS — Z23 Encounter for immunization: Secondary | ICD-10-CM | POA: Diagnosis not present

## 2015-02-21 DIAGNOSIS — E784 Other hyperlipidemia: Secondary | ICD-10-CM | POA: Diagnosis not present

## 2015-02-21 DIAGNOSIS — K210 Gastro-esophageal reflux disease with esophagitis: Secondary | ICD-10-CM | POA: Diagnosis not present

## 2015-02-21 DIAGNOSIS — J440 Chronic obstructive pulmonary disease with acute lower respiratory infection: Secondary | ICD-10-CM | POA: Diagnosis not present

## 2015-02-21 DIAGNOSIS — E161 Other hypoglycemia: Secondary | ICD-10-CM | POA: Diagnosis not present

## 2015-02-21 DIAGNOSIS — R008 Other abnormalities of heart beat: Secondary | ICD-10-CM | POA: Diagnosis not present

## 2015-02-28 DIAGNOSIS — H35383 Toxic maculopathy, bilateral: Secondary | ICD-10-CM | POA: Diagnosis not present

## 2015-05-20 DIAGNOSIS — R972 Elevated prostate specific antigen [PSA]: Secondary | ICD-10-CM | POA: Diagnosis not present

## 2015-05-28 DIAGNOSIS — Z87438 Personal history of other diseases of male genital organs: Secondary | ICD-10-CM | POA: Diagnosis not present

## 2015-05-28 DIAGNOSIS — R972 Elevated prostate specific antigen [PSA]: Secondary | ICD-10-CM | POA: Diagnosis not present

## 2015-05-28 DIAGNOSIS — E161 Other hypoglycemia: Secondary | ICD-10-CM | POA: Diagnosis not present

## 2015-05-28 DIAGNOSIS — K210 Gastro-esophageal reflux disease with esophagitis: Secondary | ICD-10-CM | POA: Diagnosis not present

## 2015-05-28 DIAGNOSIS — L570 Actinic keratosis: Secondary | ICD-10-CM | POA: Diagnosis not present

## 2015-05-28 DIAGNOSIS — E784 Other hyperlipidemia: Secondary | ICD-10-CM | POA: Diagnosis not present

## 2015-07-01 DIAGNOSIS — R972 Elevated prostate specific antigen [PSA]: Secondary | ICD-10-CM | POA: Diagnosis not present

## 2015-07-08 DIAGNOSIS — R972 Elevated prostate specific antigen [PSA]: Secondary | ICD-10-CM | POA: Diagnosis not present

## 2015-09-02 DIAGNOSIS — K210 Gastro-esophageal reflux disease with esophagitis: Secondary | ICD-10-CM | POA: Diagnosis not present

## 2015-09-02 DIAGNOSIS — E161 Other hypoglycemia: Secondary | ICD-10-CM | POA: Diagnosis not present

## 2015-09-02 DIAGNOSIS — E784 Other hyperlipidemia: Secondary | ICD-10-CM | POA: Diagnosis not present

## 2015-10-10 DIAGNOSIS — K210 Gastro-esophageal reflux disease with esophagitis: Secondary | ICD-10-CM | POA: Diagnosis not present

## 2015-10-10 DIAGNOSIS — E784 Other hyperlipidemia: Secondary | ICD-10-CM | POA: Diagnosis not present

## 2015-11-15 DIAGNOSIS — E784 Other hyperlipidemia: Secondary | ICD-10-CM | POA: Diagnosis not present

## 2015-11-15 DIAGNOSIS — K210 Gastro-esophageal reflux disease with esophagitis: Secondary | ICD-10-CM | POA: Diagnosis not present

## 2015-12-09 DIAGNOSIS — Z0000 Encounter for general adult medical examination without abnormal findings: Secondary | ICD-10-CM | POA: Diagnosis not present

## 2015-12-09 DIAGNOSIS — R008 Other abnormalities of heart beat: Secondary | ICD-10-CM | POA: Diagnosis not present

## 2015-12-09 DIAGNOSIS — E784 Other hyperlipidemia: Secondary | ICD-10-CM | POA: Diagnosis not present

## 2015-12-09 DIAGNOSIS — E161 Other hypoglycemia: Secondary | ICD-10-CM | POA: Diagnosis not present

## 2015-12-09 DIAGNOSIS — K210 Gastro-esophageal reflux disease with esophagitis: Secondary | ICD-10-CM | POA: Diagnosis not present

## 2015-12-09 DIAGNOSIS — Z1389 Encounter for screening for other disorder: Secondary | ICD-10-CM | POA: Diagnosis not present

## 2015-12-12 DIAGNOSIS — Z23 Encounter for immunization: Secondary | ICD-10-CM | POA: Diagnosis not present

## 2015-12-24 ENCOUNTER — Encounter: Payer: Self-pay | Admitting: "Endocrinology

## 2015-12-24 DIAGNOSIS — Z136 Encounter for screening for cardiovascular disorders: Secondary | ICD-10-CM | POA: Diagnosis not present

## 2015-12-24 DIAGNOSIS — K210 Gastro-esophageal reflux disease with esophagitis: Secondary | ICD-10-CM | POA: Diagnosis not present

## 2015-12-24 DIAGNOSIS — E161 Other hypoglycemia: Secondary | ICD-10-CM | POA: Diagnosis not present

## 2015-12-24 DIAGNOSIS — R008 Other abnormalities of heart beat: Secondary | ICD-10-CM | POA: Diagnosis not present

## 2015-12-24 DIAGNOSIS — E784 Other hyperlipidemia: Secondary | ICD-10-CM | POA: Diagnosis not present

## 2015-12-24 DIAGNOSIS — Z87891 Personal history of nicotine dependence: Secondary | ICD-10-CM | POA: Diagnosis not present

## 2015-12-24 DIAGNOSIS — Z8249 Family history of ischemic heart disease and other diseases of the circulatory system: Secondary | ICD-10-CM | POA: Diagnosis not present

## 2015-12-24 DIAGNOSIS — Z8489 Family history of other specified conditions: Secondary | ICD-10-CM | POA: Diagnosis not present

## 2016-01-08 DIAGNOSIS — Z23 Encounter for immunization: Secondary | ICD-10-CM | POA: Diagnosis not present

## 2016-01-10 DIAGNOSIS — R972 Elevated prostate specific antigen [PSA]: Secondary | ICD-10-CM | POA: Diagnosis not present

## 2016-01-14 ENCOUNTER — Ambulatory Visit (INDEPENDENT_AMBULATORY_CARE_PROVIDER_SITE_OTHER): Payer: Medicare Other | Admitting: "Endocrinology

## 2016-01-14 ENCOUNTER — Encounter: Payer: Self-pay | Admitting: "Endocrinology

## 2016-01-14 VITALS — BP 115/68 | HR 74 | Ht 74.0 in | Wt 195.0 lb

## 2016-01-14 DIAGNOSIS — Z720 Tobacco use: Secondary | ICD-10-CM

## 2016-01-14 DIAGNOSIS — E785 Hyperlipidemia, unspecified: Secondary | ICD-10-CM | POA: Diagnosis not present

## 2016-01-14 DIAGNOSIS — R7303 Prediabetes: Secondary | ICD-10-CM

## 2016-01-14 DIAGNOSIS — F17200 Nicotine dependence, unspecified, uncomplicated: Secondary | ICD-10-CM | POA: Insufficient documentation

## 2016-01-14 NOTE — Progress Notes (Signed)
Subjective:    Patient ID: Ronald May, male    DOB: 01/17/1941, PCP Neale Burly, MD   Past Medical History:  Diagnosis Date  . GERD (gastroesophageal reflux disease)   . History of prostatitis   . Hyperlipidemia    Past Surgical History:  Procedure Laterality Date  . Kidney stone extraction    . PROSTATE BIOPSY     Social History   Social History  . Marital status: Married    Spouse name: N/A  . Number of children: N/A  . Years of education: N/A   Social History Main Topics  . Smoking status: Former Smoker    Types: Cigarettes    Start date: 10/26/2014  . Smokeless tobacco: Never Used  . Alcohol use No  . Drug use: No  . Sexual activity: Not Asked   Other Topics Concern  . None   Social History Narrative  . None   Outpatient Encounter Prescriptions as of 01/14/2016  Medication Sig  . alfuzosin (UROXATRAL) 10 MG 24 hr tablet Take 10 mg by mouth daily with breakfast.  . calcium-vitamin D (OSCAL WITH D) 500-200 MG-UNIT per tablet Take 1 tablet by mouth daily.   . Omega-3 Fatty Acids (FISH OIL) 1000 MG CAPS Take 1 capsule by mouth 3 (three) times daily.   Marland Kitchen omeprazole (PRILOSEC) 20 MG capsule Take 20 mg by mouth daily.  . ranitidine (ZANTAC) 300 MG tablet Take 300 mg by mouth 2 (two) times daily.  . simvastatin (ZOCOR) 40 MG tablet Take 40 mg by mouth daily.  . [DISCONTINUED] nitrofurantoin (MACRODANTIN) 100 MG capsule Take 100 mg by mouth 2 (two) times daily.   No facility-administered encounter medications on file as of 01/14/2016.    ALLERGIES: No Known Allergies VACCINATION STATUS:  There is no immunization history on file for this patient.  HPI   75 year old male patient with medical history as above. He is being seen in consultation for concern of hypoglycemia requested by Dr. Sherrie Sport. - He did not bring the meter however he reports that he did have hypoglycemic episodes as low as 55. At least 2  of those events where preceded by consumption of  simple carbohydrates including waffles and soda. - In May 2017 his A1c was found to be 6% consistent with prediabetes. He was never exposed to diabetes medications. His wife has diabetes and he used her meter to monitor his blood glucose randomly. -He denies significant weight change. He never weighed in excess of his current weight of 195 pounds. - He denies heavy alcohol use. However he is a chronic heavy smoker for the last 57 years.  Review of Systems Constitutional: no weight gain/loss, no fatigue, no subjective hyperthermia/hypothermia Eyes: no blurry vision, no xerophthalmia ENT: no sore throat, no nodules palpated in throat, no dysphagia/odynophagia, no hoarseness Cardiovascular: no CP/SOB/palpitations/leg swelling Respiratory: no cough/SOB Gastrointestinal: no N/V/D/C Musculoskeletal: no muscle/joint aches Skin: no rashes Neurological: no tremors/numbness/tingling/dizziness Psychiatric: no depression/anxiety  Objective:    BP 115/68   Pulse 74   Ht 6\' 2"  (1.88 m)   Wt 195 lb (88.5 kg)   BMI 25.04 kg/m   Wt Readings from Last 3 Encounters:  01/14/16 195 lb (88.5 kg)  11/27/14 198 lb (89.8 kg)  11/19/14 199 lb (90.3 kg)    Physical Exam Constitutional: overweight, in NAD Eyes: PERRLA, EOMI, no exophthalmos ENT: moist mucous membranes, no thyromegaly, no cervical lymphadenopathy Cardiovascular: RRR, No MRG Respiratory:  Scattered wheezes on bilateral lung fields. Gastrointestinal:  abdomen soft, NT, ND, BS+ Musculoskeletal: no deformities, strength intact in all 4 Skin: moist, warm, no rashes Neurological: no tremor with outstretched hands, DTR normal in all 4    Assessment & Plan:   1. Prediabetes 2. Hyperlipidemia 3.chronic heavy smoker  - Based on her A1c done in May 2017 which was at 6%, he has prediabetes. He is a reported hypoglycemic episodes  are likely reactive hypoglycemic episodes.  - The best management for these his dietary modification by lowering  processed/simple carbohydrates and increasing protein intake. I had a long discussion with him regarding dietary modification.  - I also requested that he monitors blood glucose before meals and at bedtime and when necessary symptoms for one week and return with his meter and logs to review. I provided him with a sample meter and enough strips to perform this.  - He will have CMP and A1c determination today.  - His extensively counseled against smoking. He is advised to continue simvastatin 40 mg by mouth daily at bedtime for hyperlipidemia.   - I advised patient to maintain close follow up with Neale Burly, MD for primary care needs. Follow up plan: Return in about 1 week (around 01/21/2016) for labs today.  Glade Lloyd, MD Phone: [phone]  Fax: [phone]   01/14/2016, 3:19 PM

## 2016-01-15 LAB — COMPREHENSIVE METABOLIC PANEL
ALBUMIN: 4.2 g/dL (ref 3.6–5.1)
ALK PHOS: 56 U/L (ref 40–115)
ALT: 14 U/L (ref 9–46)
AST: 18 U/L (ref 10–35)
BILIRUBIN TOTAL: 0.4 mg/dL (ref 0.2–1.2)
BUN: 17 mg/dL (ref 7–25)
CALCIUM: 9.2 mg/dL (ref 8.6–10.3)
CHLORIDE: 105 mmol/L (ref 98–110)
CO2: 29 mmol/L (ref 20–31)
Creat: 1.31 mg/dL — ABNORMAL HIGH (ref 0.70–1.18)
GLUCOSE: 80 mg/dL (ref 65–99)
Potassium: 4.4 mmol/L (ref 3.5–5.3)
SODIUM: 140 mmol/L (ref 135–146)
Total Protein: 6.8 g/dL (ref 6.1–8.1)

## 2016-01-15 LAB — HEMOGLOBIN A1C
Hgb A1c MFr Bld: 5.5 % (ref <5.7)
Mean Plasma Glucose: 111 mg/dL

## 2016-01-16 DIAGNOSIS — R39198 Other difficulties with micturition: Secondary | ICD-10-CM | POA: Diagnosis not present

## 2016-01-16 DIAGNOSIS — R972 Elevated prostate specific antigen [PSA]: Secondary | ICD-10-CM | POA: Diagnosis not present

## 2016-01-16 DIAGNOSIS — N529 Male erectile dysfunction, unspecified: Secondary | ICD-10-CM | POA: Diagnosis not present

## 2016-01-20 DIAGNOSIS — M810 Age-related osteoporosis without current pathological fracture: Secondary | ICD-10-CM | POA: Diagnosis not present

## 2016-01-20 DIAGNOSIS — M8588 Other specified disorders of bone density and structure, other site: Secondary | ICD-10-CM | POA: Diagnosis not present

## 2016-01-20 DIAGNOSIS — M85862 Other specified disorders of bone density and structure, left lower leg: Secondary | ICD-10-CM | POA: Diagnosis not present

## 2016-01-20 DIAGNOSIS — Z79899 Other long term (current) drug therapy: Secondary | ICD-10-CM | POA: Diagnosis not present

## 2016-01-20 DIAGNOSIS — F17200 Nicotine dependence, unspecified, uncomplicated: Secondary | ICD-10-CM | POA: Diagnosis not present

## 2016-01-20 DIAGNOSIS — E7800 Pure hypercholesterolemia, unspecified: Secondary | ICD-10-CM | POA: Diagnosis not present

## 2016-01-22 ENCOUNTER — Encounter: Payer: Self-pay | Admitting: "Endocrinology

## 2016-01-22 ENCOUNTER — Ambulatory Visit (INDEPENDENT_AMBULATORY_CARE_PROVIDER_SITE_OTHER): Payer: Medicare Other | Admitting: "Endocrinology

## 2016-01-22 VITALS — BP 111/69 | HR 65 | Ht 74.0 in | Wt 193.0 lb

## 2016-01-22 DIAGNOSIS — E785 Hyperlipidemia, unspecified: Secondary | ICD-10-CM

## 2016-01-22 DIAGNOSIS — R7303 Prediabetes: Secondary | ICD-10-CM | POA: Diagnosis not present

## 2016-01-22 NOTE — Progress Notes (Signed)
Subjective:    Patient ID: Ronald May, male    DOB: 10-Jul-1940, PCP Neale Burly, MD   Past Medical History:  Diagnosis Date  . GERD (gastroesophageal reflux disease)   . History of prostatitis   . Hyperlipidemia    Past Surgical History:  Procedure Laterality Date  . Kidney stone extraction    . PROSTATE BIOPSY     Social History   Social History  . Marital status: Married    Spouse name: N/A  . Number of children: N/A  . Years of education: N/A   Social History Main Topics  . Smoking status: Former Smoker    Types: Cigarettes    Start date: 10/26/2014  . Smokeless tobacco: Never Used  . Alcohol use No  . Drug use: No  . Sexual activity: Not Asked   Other Topics Concern  . None   Social History Narrative  . None   Outpatient Encounter Prescriptions as of 01/22/2016  Medication Sig  . alfuzosin (UROXATRAL) 10 MG 24 hr tablet Take 10 mg by mouth daily with breakfast.  . calcium-vitamin D (OSCAL WITH D) 500-200 MG-UNIT per tablet Take 1 tablet by mouth daily.   . Omega-3 Fatty Acids (FISH OIL) 1000 MG CAPS Take 1 capsule by mouth 3 (three) times daily.   Marland Kitchen omeprazole (PRILOSEC) 20 MG capsule Take 20 mg by mouth daily.  . ranitidine (ZANTAC) 300 MG tablet Take 300 mg by mouth 2 (two) times daily.  . simvastatin (ZOCOR) 40 MG tablet Take 40 mg by mouth daily.   No facility-administered encounter medications on file as of 01/22/2016.    ALLERGIES: No Known Allergies VACCINATION STATUS:  There is no immunization history on file for this patient.  HPI   75 year old male patient with medical history as above. He is being seen in f/u for concern of hypoglycemia requested by Dr. Sherrie Sport. - Since his last visit, he was given the meter and strips to monitor blood glucose and brings his logs showing 29 readings none of them in hypoglycemic range. 100% on target. He is prior episodes of feeding of hypoglycemia were preceded by consumption of simple carbohydrates  including waffles and soda. - He has made significant dietary changes since then. -  His A1c has improved to 5.5% from 6.1% in May 2017.   -  He was never exposed to diabetes medications. His wife has diabetes and he used her meter to monitor his blood glucose randomly. -He denies significant weight change. He never weighed in excess of his current weight of 195 pounds. - He denies heavy alcohol use. However he is a chronic heavy smoker for the last 57 years.  Review of Systems Constitutional: no weight gain/loss, no fatigue, no subjective hyperthermia/hypothermia Eyes: no blurry vision, no xerophthalmia ENT: no sore throat, no nodules palpated in throat, no dysphagia/odynophagia, no hoarseness Cardiovascular: no CP/SOB/palpitations/leg swelling Respiratory: no cough/SOB Gastrointestinal: no N/V/D/C Musculoskeletal: no muscle/joint aches Skin: no rashes Neurological: no tremors/numbness/tingling/dizziness Psychiatric: no depression/anxiety  Objective:    BP 111/69   Pulse 65   Ht 6\' 2"  (1.88 m)   Wt 193 lb (87.5 kg)   BMI 24.78 kg/m   Wt Readings from Last 3 Encounters:  01/22/16 193 lb (87.5 kg)  01/14/16 195 lb (88.5 kg)  11/27/14 198 lb (89.8 kg)    Physical Exam Constitutional: overweight, in NAD Eyes: PERRLA, EOMI, no exophthalmos ENT: moist mucous membranes, no thyromegaly, no cervical lymphadenopathy Cardiovascular: RRR, No MRG Respiratory:  Scattered wheezes on bilateral lung fields. Gastrointestinal: abdomen soft, NT, ND, BS+ Musculoskeletal: no deformities, strength intact in all 4 Skin: moist, warm, no rashes Neurological: no tremor with outstretched hands, DTR normal in all 4    Assessment & Plan:   1. Prediabetes 2. Hyperlipidemia 3.chronic heavy smoker  - He is meter and logs for the last 10 days shows average blood glucose of 110 ((n= 29) . All of these readings are within target, no hypoglycemia. His repeat labs show A1c of 5.5% improving from 6.1%  . -He is a reported hypoglycemic episodes  are likely reactive hypoglycemic episodes.  - The best management for these his dietary modification by lowering processed/simple carbohydrates and increasing protein intake. I had a long discussion with him regarding dietary modification.  - He will continue to monitor only during symptoms of hypoglycemia.  - His extensively counseled against smoking. He is advised to continue simvastatin 40 mg by mouth daily at bedtime for hyperlipidemia.   - I advised patient to maintain close follow up with Neale Burly, MD for primary care needs. Follow up plan: Return in about 6 months (around 07/21/2016) for follow up with pre-visit labs.  Glade Lloyd, MD Phone: [phone]  Fax: [phone]   01/22/2016, 1:54 PM

## 2016-01-24 DIAGNOSIS — E161 Other hypoglycemia: Secondary | ICD-10-CM | POA: Diagnosis not present

## 2016-01-24 DIAGNOSIS — K210 Gastro-esophageal reflux disease with esophagitis: Secondary | ICD-10-CM | POA: Diagnosis not present

## 2016-01-24 DIAGNOSIS — E784 Other hyperlipidemia: Secondary | ICD-10-CM | POA: Diagnosis not present

## 2016-01-24 DIAGNOSIS — R008 Other abnormalities of heart beat: Secondary | ICD-10-CM | POA: Diagnosis not present

## 2016-02-28 DIAGNOSIS — E161 Other hypoglycemia: Secondary | ICD-10-CM | POA: Diagnosis not present

## 2016-02-28 DIAGNOSIS — K210 Gastro-esophageal reflux disease with esophagitis: Secondary | ICD-10-CM | POA: Diagnosis not present

## 2016-02-28 DIAGNOSIS — R008 Other abnormalities of heart beat: Secondary | ICD-10-CM | POA: Diagnosis not present

## 2016-02-28 DIAGNOSIS — E784 Other hyperlipidemia: Secondary | ICD-10-CM | POA: Diagnosis not present

## 2016-03-05 DIAGNOSIS — H35373 Puckering of macula, bilateral: Secondary | ICD-10-CM | POA: Diagnosis not present

## 2016-03-05 DIAGNOSIS — H35351 Cystoid macular degeneration, right eye: Secondary | ICD-10-CM | POA: Diagnosis not present

## 2016-03-09 DIAGNOSIS — K210 Gastro-esophageal reflux disease with esophagitis: Secondary | ICD-10-CM | POA: Diagnosis not present

## 2016-03-09 DIAGNOSIS — E161 Other hypoglycemia: Secondary | ICD-10-CM | POA: Diagnosis not present

## 2016-03-09 DIAGNOSIS — E784 Other hyperlipidemia: Secondary | ICD-10-CM | POA: Diagnosis not present

## 2016-03-16 DIAGNOSIS — R001 Bradycardia, unspecified: Secondary | ICD-10-CM | POA: Diagnosis not present

## 2016-03-16 DIAGNOSIS — N400 Enlarged prostate without lower urinary tract symptoms: Secondary | ICD-10-CM | POA: Diagnosis not present

## 2016-03-16 DIAGNOSIS — Z8051 Family history of malignant neoplasm of kidney: Secondary | ICD-10-CM | POA: Diagnosis not present

## 2016-03-16 DIAGNOSIS — Z808 Family history of malignant neoplasm of other organs or systems: Secondary | ICD-10-CM | POA: Diagnosis not present

## 2016-03-16 DIAGNOSIS — R531 Weakness: Secondary | ICD-10-CM | POA: Diagnosis not present

## 2016-03-16 DIAGNOSIS — R062 Wheezing: Secondary | ICD-10-CM | POA: Diagnosis not present

## 2016-03-16 DIAGNOSIS — R008 Other abnormalities of heart beat: Secondary | ICD-10-CM | POA: Diagnosis not present

## 2016-03-16 DIAGNOSIS — K219 Gastro-esophageal reflux disease without esophagitis: Secondary | ICD-10-CM | POA: Diagnosis not present

## 2016-03-16 DIAGNOSIS — R42 Dizziness and giddiness: Secondary | ICD-10-CM | POA: Diagnosis not present

## 2016-03-16 DIAGNOSIS — E785 Hyperlipidemia, unspecified: Secondary | ICD-10-CM | POA: Diagnosis not present

## 2016-03-16 DIAGNOSIS — Z79899 Other long term (current) drug therapy: Secondary | ICD-10-CM | POA: Diagnosis not present

## 2016-03-16 DIAGNOSIS — Z87442 Personal history of urinary calculi: Secondary | ICD-10-CM | POA: Diagnosis not present

## 2016-03-16 DIAGNOSIS — F17200 Nicotine dependence, unspecified, uncomplicated: Secondary | ICD-10-CM | POA: Diagnosis not present

## 2016-03-16 DIAGNOSIS — R55 Syncope and collapse: Secondary | ICD-10-CM | POA: Diagnosis not present

## 2016-03-16 DIAGNOSIS — J449 Chronic obstructive pulmonary disease, unspecified: Secondary | ICD-10-CM | POA: Diagnosis not present

## 2016-03-17 ENCOUNTER — Encounter (HOSPITAL_COMMUNITY): Payer: Self-pay | Admitting: Internal Medicine

## 2016-03-17 ENCOUNTER — Inpatient Hospital Stay (HOSPITAL_COMMUNITY)
Admission: AD | Admit: 2016-03-17 | Discharge: 2016-03-19 | DRG: 244 | Disposition: A | Discharge status: Home / Self Care | Payer: Medicare Other | Source: Other Acute Inpatient Hospital | Attending: Internal Medicine | Admitting: Internal Medicine

## 2016-03-17 DIAGNOSIS — E785 Hyperlipidemia, unspecified: Secondary | ICD-10-CM | POA: Diagnosis present

## 2016-03-17 DIAGNOSIS — D696 Thrombocytopenia, unspecified: Secondary | ICD-10-CM | POA: Diagnosis present

## 2016-03-17 DIAGNOSIS — D649 Anemia, unspecified: Secondary | ICD-10-CM | POA: Diagnosis present

## 2016-03-17 DIAGNOSIS — Z950 Presence of cardiac pacemaker: Secondary | ICD-10-CM

## 2016-03-17 DIAGNOSIS — R55 Syncope and collapse: Secondary | ICD-10-CM | POA: Diagnosis not present

## 2016-03-17 DIAGNOSIS — I495 Sick sinus syndrome: Principal | ICD-10-CM

## 2016-03-17 DIAGNOSIS — Z8249 Family history of ischemic heart disease and other diseases of the circulatory system: Secondary | ICD-10-CM

## 2016-03-17 DIAGNOSIS — R001 Bradycardia, unspecified: Secondary | ICD-10-CM | POA: Diagnosis not present

## 2016-03-17 DIAGNOSIS — Z720 Tobacco use: Secondary | ICD-10-CM | POA: Diagnosis not present

## 2016-03-17 DIAGNOSIS — R008 Other abnormalities of heart beat: Secondary | ICD-10-CM | POA: Diagnosis not present

## 2016-03-17 DIAGNOSIS — R42 Dizziness and giddiness: Secondary | ICD-10-CM | POA: Diagnosis not present

## 2016-03-17 DIAGNOSIS — J449 Chronic obstructive pulmonary disease, unspecified: Secondary | ICD-10-CM | POA: Diagnosis not present

## 2016-03-17 DIAGNOSIS — K219 Gastro-esophageal reflux disease without esophagitis: Secondary | ICD-10-CM | POA: Diagnosis present

## 2016-03-17 DIAGNOSIS — N400 Enlarged prostate without lower urinary tract symptoms: Secondary | ICD-10-CM | POA: Diagnosis present

## 2016-03-17 DIAGNOSIS — Z800 Family history of malignant neoplasm of digestive organs: Secondary | ICD-10-CM | POA: Diagnosis not present

## 2016-03-17 DIAGNOSIS — F17210 Nicotine dependence, cigarettes, uncomplicated: Secondary | ICD-10-CM | POA: Diagnosis present

## 2016-03-17 HISTORY — DX: Personal history of urinary calculi: Z87.442

## 2016-03-17 HISTORY — DX: Bradycardia, unspecified: R00.1

## 2016-03-17 HISTORY — DX: Low back pain: M54.5

## 2016-03-17 HISTORY — DX: Low back pain, unspecified: M54.50

## 2016-03-17 HISTORY — DX: Benign prostatic hyperplasia without lower urinary tract symptoms: N40.0

## 2016-03-17 HISTORY — DX: Other chronic pain: G89.29

## 2016-03-17 HISTORY — DX: Unspecified osteoarthritis, unspecified site: M19.90

## 2016-03-17 LAB — CBC
HCT: 35.7 % — ABNORMAL LOW (ref 39.0–52.0)
HEMOGLOBIN: 11.6 g/dL — AB (ref 13.0–17.0)
MCH: 30.3 pg (ref 26.0–34.0)
MCHC: 32.5 g/dL (ref 30.0–36.0)
MCV: 93.2 fL (ref 78.0–100.0)
Platelets: 118 10*3/uL — ABNORMAL LOW (ref 150–400)
RBC: 3.83 MIL/uL — AB (ref 4.22–5.81)
RDW: 13.3 % (ref 11.5–15.5)
WBC: 10.1 10*3/uL (ref 4.0–10.5)

## 2016-03-17 LAB — COMPREHENSIVE METABOLIC PANEL
ALBUMIN: 3.6 g/dL (ref 3.5–5.0)
ALT: 16 U/L — AB (ref 17–63)
AST: 16 U/L (ref 15–41)
Alkaline Phosphatase: 51 U/L (ref 38–126)
Anion gap: 6 (ref 5–15)
BUN: 13 mg/dL (ref 6–20)
CALCIUM: 9.1 mg/dL (ref 8.9–10.3)
CO2: 27 mmol/L (ref 22–32)
CREATININE: 1.24 mg/dL (ref 0.61–1.24)
Chloride: 102 mmol/L (ref 101–111)
GFR calc non Af Amer: 56 mL/min — ABNORMAL LOW (ref >60)
GLUCOSE: 104 mg/dL — AB (ref 65–99)
Potassium: 4.1 mmol/L (ref 3.5–5.1)
SODIUM: 135 mmol/L (ref 135–145)
Total Bilirubin: 0.3 mg/dL (ref 0.3–1.2)
Total Protein: 6.4 g/dL — ABNORMAL LOW (ref 6.5–8.1)

## 2016-03-17 LAB — PROTIME-INR
INR: 1.13
Prothrombin Time: 14.6 seconds (ref 11.4–15.2)

## 2016-03-17 LAB — TSH: TSH: 1.709 u[IU]/mL (ref 0.350–4.500)

## 2016-03-17 MED ORDER — SIMVASTATIN 40 MG PO TABS
40.0000 mg | ORAL_TABLET | Freq: Every day | ORAL | Status: DC
  Administered 2016-03-17 – 2016-03-18 (×2): 40 mg via ORAL
  Filled 2016-03-17 (×2): qty 1

## 2016-03-17 MED ORDER — OMEGA-3-ACID ETHYL ESTERS 1 G PO CAPS
1.0000 g | ORAL_CAPSULE | Freq: Two times a day (BID) | ORAL | Status: DC
  Administered 2016-03-18 – 2016-03-19 (×3): 1 g via ORAL
  Filled 2016-03-17 (×3): qty 1

## 2016-03-17 MED ORDER — CALCIUM CARBONATE-VITAMIN D 500-200 MG-UNIT PO TABS
1.0000 | ORAL_TABLET | Freq: Every day | ORAL | Status: DC
  Administered 2016-03-18 – 2016-03-19 (×2): 1 via ORAL
  Filled 2016-03-17 (×2): qty 1

## 2016-03-17 MED ORDER — PANTOPRAZOLE SODIUM 40 MG PO TBEC
40.0000 mg | DELAYED_RELEASE_TABLET | Freq: Every day | ORAL | Status: DC
  Administered 2016-03-18 – 2016-03-19 (×2): 40 mg via ORAL
  Filled 2016-03-17 (×2): qty 1

## 2016-03-17 MED ORDER — ASPIRIN EC 81 MG PO TBEC
81.0000 mg | DELAYED_RELEASE_TABLET | Freq: Every day | ORAL | Status: DC
  Administered 2016-03-18 – 2016-03-19 (×2): 81 mg via ORAL
  Filled 2016-03-17 (×2): qty 1

## 2016-03-17 MED ORDER — HEPARIN SODIUM (PORCINE) 5000 UNIT/ML IJ SOLN: 5000.0000 [IU] | Freq: Three times a day (TID) | INTRAMUSCULAR | Status: DC

## 2016-03-17 MED ORDER — ALFUZOSIN HCL ER 10 MG PO TB24
10.0000 mg | ORAL_TABLET | Freq: Every day | ORAL | Status: DC
  Administered 2016-03-17 – 2016-03-18 (×2): 10 mg via ORAL
  Filled 2016-03-17 (×3): qty 1

## 2016-03-17 NOTE — H&P (Signed)
Cardiology H&P    Patient ID: Ronald May MRN: [id number], DOB/AGE: [birth year]   Admit date: 03/17/2016 Date of Consult: 03/17/2016  Primary Physician: Neale Burly, MD  Primary Cardiologist: Dr. Domenic Polite    Patient Profile    Ronald May is a 75 year old male with a past medical history of HLD and BPH. He presented to [facility] on 02/2016 with presyncope, dizziness and bradycardia.   History of Present Illness   Ronald May has noticed for the past month or so he has "spells" where he feels "swimmy headed", not particularly dizzy and feels like he could pass out. Also feels flushed with a hot feeling throughout his body that starts in his head and spreads to his abdomen. He denies associated SOB, nausea. He does at times feel somewhat diaphoretic. His wife is at the bedside and says that when he has these "spells" he gets a very strange look on his face.   On Sunday, 2 days ago, he had about 8 spells. This was the most he has every experienced in one day. Yesterday, he was driving his wife to the doctor and felt very weak and flushed. The "spells" are not necessarily associated with activity or inactivity. He has been told his whole life that he has a low resting heart rate, however it has never caused him any trouble until now.   He is not on any AV nodal blocking agents. He is a current smoker, has smoked one pack per day for 50 years. Denies chest pain, SOB, palpitations, and syncope. He has never had an ischemic evaluation.   Past Medical History   Past Medical History:  Diagnosis Date  . GERD (gastroesophageal reflux disease)   . History of prostatitis   . Hyperlipidemia   . Sinus bradycardia     Past Surgical History:  Procedure Laterality Date  . Kidney stone extraction    . PROSTATE BIOPSY       Allergies  No Known Allergies  Inpatient Medications      Family History    Family History  Problem Relation Age of Onset  . Liver cancer Mother   . CAD  Brother     Social History    Social History   Social History  . Marital status: Married    Spouse name: N/A  . Number of children: N/A  . Years of education: N/A   Occupational History  . Not on file.   Social History Main Topics  . Smoking status: Current Every Day Smoker    Types: Cigarettes    Start date: 10/26/2014  . Smokeless tobacco: Never Used  . Alcohol use No  . Drug use: No  . Sexual activity: Not on file   Other Topics Concern  . Not on file   Social History Narrative   Pt lives with wife in [place].  Retired Charity fundraiser.        Review of Systems    General:  No chills, fever, night sweats or weight changes.  Cardiovascular:  No chest pain, dyspnea on exertion, edema, orthopnea, palpitations, paroxysmal nocturnal dyspnea. Dermatological: No rash, lesions/masses Respiratory: No cough, dyspnea Urologic: No hematuria, dysuria Abdominal:   No nausea, vomiting, diarrhea, bright red blood per rectum, melena, or hematemesis Neurologic:  No visual changes, wkns, changes in mental status. All other systems reviewed and are otherwise negative except as noted above.  Physical Exam    Blood pressure (!) 97/54, pulse (!) 52, temperature  98.2 F (36.8 C), temperature source Oral, resp. rate 16, weight 199 lb (90.3 kg), SpO2 99 %.  General: Pleasant, NAD Psych: Normal affect. Neuro: Alert and oriented X 3. Moves all extremities spontaneously. HEENT: Normal  Neck: Supple without bruits or JVD. Lungs:  Resp regular and unlabored, CTA. Heart: RRR no s3, s4, or murmurs. Abdomen: Soft, non-tender, non-distended, BS + x 4.  Extremities: No clubbing, cyanosis or edema. DP/PT/Radials 2+ and equal bilaterally.  Labs    pending  Radiology Studies    pending  EKG & Cardiac Imaging    EKG: Sinus bradycardia with poor R wave progression.   Echocardiogram: pending   Assessment & Plan    1. Symptomatic bradycardia: Presents with presyncope. EKG  shows sinus bradycardia. At the time of my encounter, the patient had a "spell" and telemetry showed sinus bradycardia with a rate of 39. He has been seen in the past by Dr. Domenic Polite (Aug. 2016). At that time he described more orthostatic symptoms, and wore a 48 hour monitor. It revealed heart rates from 38 bpm to 135 bpm, no pauses or arrhythmias.   He denies orthostatic symptoms currently. Not on any AV nodal blocking agents. He does have risk factors for CAD, will obtain echo to evaluate for wall motion abnormalities. He denies any ischemic symptoms.   Will get echo, EKG, and check orthostatics. Continue telemetry. He would benefit from an EP consult, although he is hemodynamically stable and does not need emergent pacemaker implantation.   Will check TSH.   2. Tobacco abuse Cessation advised  Signed, Jettie Booze NP  I have seen, examined the patient, and reviewed the above assessment and plan.  Changes to above are made where necessary.   The patient has symptomatic sinus bradycardia.  No reversible causes are found.   I would therefore recommend pacemaker implantation at this time.  Risks, benefits, alternatives to pacemaker implantation were discussed in detail with the patient today. The patient understands that the risks include but are not limited to bleeding, infection, pneumothorax, perforation, tamponade, vascular damage, renal failure, MI, stroke, death,  and lead dislodgement and wishes to proceed. We will therefore schedule the procedure at the next available time.  I have tentatively placed on schedule for Dr Lovena Le to do the procedure in am.   Labs and echo are currently pending.  Thompson Grayer MD, [facility] 03/17/2016 7:06 PM

## 2016-03-18 ENCOUNTER — Encounter (HOSPITAL_COMMUNITY)
Admission: AD | Disposition: A | Discharge status: Home / Self Care | Payer: Self-pay | Source: Other Acute Inpatient Hospital | Attending: Internal Medicine

## 2016-03-18 ENCOUNTER — Encounter (HOSPITAL_COMMUNITY): Payer: Self-pay | Admitting: Internal Medicine

## 2016-03-18 DIAGNOSIS — I495 Sick sinus syndrome: Secondary | ICD-10-CM | POA: Diagnosis present

## 2016-03-18 HISTORY — PX: EP IMPLANTABLE DEVICE: SHX172B

## 2016-03-18 LAB — BASIC METABOLIC PANEL
ANION GAP: 7 (ref 5–15)
BUN: 13 mg/dL (ref 6–20)
CO2: 27 mmol/L (ref 22–32)
Calcium: 9.3 mg/dL (ref 8.9–10.3)
Chloride: 106 mmol/L (ref 101–111)
Creatinine, Ser: 1.24 mg/dL (ref 0.61–1.24)
GFR calc Af Amer: >60 mL/min (ref >60)
GFR calc non Af Amer: 56 mL/min — ABNORMAL LOW (ref >60)
GLUCOSE: 101 mg/dL — AB (ref 65–99)
POTASSIUM: 4.4 mmol/L (ref 3.5–5.1)
Sodium: 140 mmol/L (ref 135–145)

## 2016-03-18 LAB — SURGICAL PCR SCREEN
MRSA, PCR: NEGATIVE
STAPHYLOCOCCUS AUREUS: NEGATIVE

## 2016-03-18 LAB — CBC
HEMATOCRIT: 35.1 % — AB (ref 39.0–52.0)
HEMOGLOBIN: 11.3 g/dL — AB (ref 13.0–17.0)
MCH: 30.1 pg (ref 26.0–34.0)
MCHC: 32.2 g/dL (ref 30.0–36.0)
MCV: 93.4 fL (ref 78.0–100.0)
Platelets: 113 10*3/uL — ABNORMAL LOW (ref 150–400)
RBC: 3.76 MIL/uL — ABNORMAL LOW (ref 4.22–5.81)
RDW: 13.4 % (ref 11.5–15.5)
WBC: 9.4 10*3/uL (ref 4.0–10.5)

## 2016-03-18 SURGERY — PACEMAKER IMPLANT
Anesthesia: LOCAL

## 2016-03-18 MED ORDER — CEFAZOLIN SODIUM-DEXTROSE 2-4 GM/100ML-% IV SOLN
INTRAVENOUS | Status: AC
  Filled 2016-03-18: qty 100

## 2016-03-18 MED ORDER — SODIUM CHLORIDE 0.45 % IV SOLN
INTRAVENOUS | Status: DC
  Administered 2016-03-18: 06:00:00 via INTRAVENOUS

## 2016-03-18 MED ORDER — SODIUM CHLORIDE 0.9 % IV SOLN
INTRAVENOUS | Status: DC
  Administered 2016-03-18: 06:00:00 via INTRAVENOUS

## 2016-03-18 MED ORDER — HEPARIN (PORCINE) IN NACL 2-0.9 UNIT/ML-% IJ SOLN
INTRAMUSCULAR | Status: AC
  Filled 2016-03-18: qty 500

## 2016-03-18 MED ORDER — MIDAZOLAM HCL 5 MG/5ML IJ SOLN
INTRAMUSCULAR | Status: AC
  Filled 2016-03-18: qty 5

## 2016-03-18 MED ORDER — MIDAZOLAM HCL 5 MG/5ML IJ SOLN
INTRAMUSCULAR | Status: DC | PRN
  Administered 2016-03-18: 2 mg via INTRAVENOUS

## 2016-03-18 MED ORDER — FENTANYL CITRATE (PF) 100 MCG/2ML IJ SOLN
INTRAMUSCULAR | Status: AC
  Filled 2016-03-18: qty 2

## 2016-03-18 MED ORDER — CEFAZOLIN IN D5W 1 GM/50ML IV SOLN
1.0000 g | Freq: Four times a day (QID) | INTRAVENOUS | Status: AC
  Administered 2016-03-18 – 2016-03-19 (×3): 1 g via INTRAVENOUS
  Filled 2016-03-18 (×3): qty 50

## 2016-03-18 MED ORDER — ONDANSETRON HCL 4 MG/2ML IJ SOLN: 4.0000 mg | Freq: Four times a day (QID) | INTRAMUSCULAR | Status: DC | PRN

## 2016-03-18 MED ORDER — SODIUM CHLORIDE 0.9 % IR SOLN
Status: AC
  Filled 2016-03-18: qty 2

## 2016-03-18 MED ORDER — FENTANYL CITRATE (PF) 100 MCG/2ML IJ SOLN
INTRAMUSCULAR | Status: DC | PRN
  Administered 2016-03-18: 12.5 ug via INTRAVENOUS

## 2016-03-18 MED ORDER — LIDOCAINE HCL (PF) 1 % IJ SOLN
INTRAMUSCULAR | Status: DC | PRN
  Administered 2016-03-18: 60 mL

## 2016-03-18 MED ORDER — SODIUM CHLORIDE 0.9 % IR SOLN
80.0000 mg | Status: AC
  Administered 2016-03-18: 80 mg

## 2016-03-18 MED ORDER — ACETAMINOPHEN 325 MG PO TABS
325.0000 mg | ORAL_TABLET | ORAL | Status: DC | PRN
Start: 2016-03-18 — End: 2016-03-19
  Administered 2016-03-18: 650 mg via ORAL
  Filled 2016-03-18: qty 2

## 2016-03-18 MED ORDER — LIDOCAINE HCL (PF) 1 % IJ SOLN
INTRAMUSCULAR | Status: AC
  Filled 2016-03-18: qty 60

## 2016-03-18 MED ORDER — CEFAZOLIN SODIUM-DEXTROSE 2-4 GM/100ML-% IV SOLN
2.0000 g | INTRAVENOUS | Status: AC
  Administered 2016-03-18: 2 g via INTRAVENOUS

## 2016-03-18 MED ORDER — HEPARIN (PORCINE) IN NACL 2-0.9 UNIT/ML-% IJ SOLN
INTRAMUSCULAR | Status: DC | PRN
  Administered 2016-03-18: 08:00:00

## 2016-03-18 SURGICAL SUPPLY — 7 items
CABLE SURGICAL S-101-97-12 (CABLE) ×2 IMPLANT
LEAD TENDRIL MRI 52CM LPA1200M (Lead) ×2 IMPLANT
LEAD TENDRIL MRI 58CM LPA1200M (Lead) ×2 IMPLANT
PACEMAKER ASSURITY DR-RF (Pacemaker) ×2 IMPLANT
PAD DEFIB LIFELINK (PAD) ×2 IMPLANT
SHEATH COOK PEEL AWAY SET 8F (SHEATH) ×4 IMPLANT
TRAY PACEMAKER INSERTION (PACKS) ×2 IMPLANT

## 2016-03-18 NOTE — H&P (View-Only) (Signed)
    SUBJECTIVE: The patient is doing well today.  At this time, he denies chest pain, shortness of breath, or any new concerns.  Marland Kitchen alfuzosin  10 mg Oral Q breakfast  . aspirin EC  81 mg Oral Daily  . calcium-vitamin D  1 tablet Oral Daily  .  ceFAZolin (ANCEF) IV  2 g Intravenous On Call  . gentamicin irrigation  80 mg Irrigation On Call  . omega-3 acid ethyl esters  1 g Oral BID  . pantoprazole  40 mg Oral Daily  . simvastatin  40 mg Oral q1800   . sodium chloride 10 mL/hr at 03/18/16 0606  . sodium chloride 50 mL/hr at 03/18/16 0611    OBJECTIVE: Physical Exam: Vitals:   03/17/16 1807 03/17/16 1946 03/17/16 2114 03/18/16 0318  BP: (!) 97/54 (!) 104/56 (!) 105/51 (!) 115/59  Pulse: (!) 52 (!) 39  (!) 40  Resp:  16  16  Temp:  98.1 F (36.7 C)  98.1 F (36.7 C)  TempSrc:  Oral  Oral  SpO2:  93% 94% 95%  Weight:        Intake/Output Summary (Last 24 hours) at 03/18/16 0700 Last data filed at 03/18/16 [id number]  Gross per 24 hour  Intake                0 ml  Output              600 ml  Net             -600 ml    Telemetry reveals -sinus bradycardia   GEN- The patient is well appearing, alert and oriented x 3 today.   Head- normocephalic, atraumatic Eyes-  Sclera clear, conjunctiva pink Ears- hearing intact Oropharynx- clear Neck- supple, no JVP Lungs- Clear to ausculation bilaterally, normal work of breathing Heart- Regular rate and rhythm, no significant murmurs, no rubs or gallops GI- soft, NT, ND Extremities- no clubbing, cyanosis, or edema Skin- no rash or lesion Psych- euthymic mood, full affect Neuro- no gross deficits appreciated  LABS: Basic Metabolic Panel:  Recent Labs  03/17/16 1843 03/18/16 0310  NA 135 140  K 4.1 4.4  CL 102 106  CO2 27 27  GLUCOSE 104* 101*  BUN 13 13  CREATININE 1.24 1.24  CALCIUM 9.1 9.3   Liver Function Tests:  Recent Labs  03/17/16 1843  AST 16  ALT 16*  ALKPHOS 51  BILITOT 0.3  PROT 6.4*  ALBUMIN 3.6    CBC:  Recent Labs  03/17/16 1843 03/18/16 0310  WBC 10.1 9.4  HGB 11.6* 11.3*  HCT 35.7* 35.1*  MCV 93.2 93.4  PLT 118* 113*   Thyroid Function Tests:  Recent Labs  03/17/16 1843  TSH 1.709    ASSESSMENT AND PLAN:   1. Symptomatic bradycardia     No nodal blocking or rate limiting meds     Labs reviewed, stable, WBC 9.4, TSH 1.709      He is in the schedule this morning for PPM implant, await his echo.   Tommye Standard, PA-C 03/18/2016 7:00 AM   EP Attending  Patient seen and examined. Agree with above. The patient has symptomatic sinus node dysfunction with dizziness and near syncope in the setting of sinus bradycardia in the 20's and 30's. He is on non sinus node blocking drugs. I have discussed the risks/benefits/goals/expectations of PPM insertion and he wishes to proceed.  Mikle Bosworth.D.

## 2016-03-18 NOTE — Interval H&P Note (Signed)
History and Physical Interval Note:  03/18/2016 7:27 AM  Ronald May  has presented today for surgery, with the diagnosis of Bradycardia  The various methods of treatment have been discussed with the patient and family. After consideration of risks, benefits and other options for treatment, the patient has consented to  Procedure(s): Pacemaker Implant (N/A) as a surgical intervention .  The patient's history has been reviewed, patient examined, no change in status, stable for surgery.  I have reviewed the patient's chart and labs.  Questions were answered to the patient's satisfaction.     Cristopher Peru

## 2016-03-18 NOTE — Progress Notes (Signed)
    SUBJECTIVE: The patient is doing well today.  At this time, he denies chest pain, shortness of breath, or any new concerns.  Marland Kitchen alfuzosin  10 mg Oral Q breakfast  . aspirin EC  81 mg Oral Daily  . calcium-vitamin D  1 tablet Oral Daily  .  ceFAZolin (ANCEF) IV  2 g Intravenous On Call  . gentamicin irrigation  80 mg Irrigation On Call  . omega-3 acid ethyl esters  1 g Oral BID  . pantoprazole  40 mg Oral Daily  . simvastatin  40 mg Oral q1800   . sodium chloride 10 mL/hr at 03/18/16 0606  . sodium chloride 50 mL/hr at 03/18/16 0611    OBJECTIVE: Physical Exam: Vitals:   03/17/16 1807 03/17/16 1946 03/17/16 2114 03/18/16 0318  BP: (!) 97/54 (!) 104/56 (!) 105/51 (!) 115/59  Pulse: (!) 52 (!) 39  (!) 40  Resp:  16  16  Temp:  98.1 F (36.7 C)  98.1 F (36.7 C)  TempSrc:  Oral  Oral  SpO2:  93% 94% 95%  Weight:        Intake/Output Summary (Last 24 hours) at 03/18/16 0700 Last data filed at 03/18/16 [id number]  Gross per 24 hour  Intake                0 ml  Output              600 ml  Net             -600 ml    Telemetry reveals -sinus bradycardia   GEN- The patient is well appearing, alert and oriented x 3 today.   Head- normocephalic, atraumatic Eyes-  Sclera clear, conjunctiva pink Ears- hearing intact Oropharynx- clear Neck- supple, no JVP Lungs- Clear to ausculation bilaterally, normal work of breathing Heart- Regular rate and rhythm, no significant murmurs, no rubs or gallops GI- soft, NT, ND Extremities- no clubbing, cyanosis, or edema Skin- no rash or lesion Psych- euthymic mood, full affect Neuro- no gross deficits appreciated  LABS: Basic Metabolic Panel:  Recent Labs  03/17/16 1843 03/18/16 0310  NA 135 140  K 4.1 4.4  CL 102 106  CO2 27 27  GLUCOSE 104* 101*  BUN 13 13  CREATININE 1.24 1.24  CALCIUM 9.1 9.3   Liver Function Tests:  Recent Labs  03/17/16 1843  AST 16  ALT 16*  ALKPHOS 51  BILITOT 0.3  PROT 6.4*  ALBUMIN 3.6    CBC:  Recent Labs  03/17/16 1843 03/18/16 0310  WBC 10.1 9.4  HGB 11.6* 11.3*  HCT 35.7* 35.1*  MCV 93.2 93.4  PLT 118* 113*   Thyroid Function Tests:  Recent Labs  03/17/16 1843  TSH 1.709    ASSESSMENT AND PLAN:   1. Symptomatic bradycardia     No nodal blocking or rate limiting meds     Labs reviewed, stable, WBC 9.4, TSH 1.709      He is in the schedule this morning for PPM implant, await his echo.   Tommye Standard, PA-C 03/18/2016 7:00 AM   EP Attending  Patient seen and examined. Agree with above. The patient has symptomatic sinus node dysfunction with dizziness and near syncope in the setting of sinus bradycardia in the 20's and 30's. He is on non sinus node blocking drugs. I have discussed the risks/benefits/goals/expectations of PPM insertion and he wishes to proceed.  Mikle Bosworth.D.

## 2016-03-19 ENCOUNTER — Encounter (HOSPITAL_COMMUNITY): Payer: Self-pay | Admitting: Physician Assistant

## 2016-03-19 ENCOUNTER — Inpatient Hospital Stay (HOSPITAL_COMMUNITY): Payer: Medicare Other

## 2016-03-19 ENCOUNTER — Telehealth: Payer: Self-pay | Admitting: Physician Assistant

## 2016-03-19 DIAGNOSIS — Z950 Presence of cardiac pacemaker: Secondary | ICD-10-CM

## 2016-03-19 NOTE — Discharge Instructions (Signed)
° ° °  Supplemental Discharge Instructions for  Pacemaker Patients  Activity No heavy lifting or vigorous activity with your left/right arm for 6 to 8 weeks.  Do not raise your left/right arm above your head for one week.  Gradually raise your affected arm as drawn below.             03/22/16                   03/23/16                   03/24/16                03/25/16 __  NO DRIVING for 1 week    ; you may begin driving on  [id number]  .  WOUND CARE - Keep the wound area clean and dry.  Do not get this area wet for one week. No showers for one week; you may shower on  03/25/16   . - The tape/steri-strips on your wound will fall off; do not pull them off.  No bandage is needed on the site.  DO  NOT apply any creams, oils, or ointments to the wound area. - If you notice any drainage or discharge from the wound, any swelling or bruising at the site, or you develop a fever > 101? F after you are discharged home, call the office at once.  Special Instructions - You are still able to use cellular telephones; use the ear opposite the side where you have your pacemaker.  Avoid carrying your cellular phone near your device. - When traveling through airports, show security personnel your identification card to avoid being screened in the metal detectors.  Ask the security personnel to use the hand wand. - Avoid arc welding equipment, MRI testing (magnetic resonance imaging), TENS units (transcutaneous nerve stimulators).  Call the office for questions about other devices. - Avoid electrical appliances that are in poor condition or are not properly grounded. - Microwave ovens are safe to be near or to operate.

## 2016-03-19 NOTE — Telephone Encounter (Signed)
Entered in error

## 2016-03-19 NOTE — Discharge Summary (Signed)
Discharge Summary    Patient ID: Ronald May,  MRN: [id number], DOB/AGE: Sep 21, 1940 75 y.o.  Admit date: 03/17/2016 Discharge date: 03/19/2016  Primary Care Provider: Neale Burly Primary Cardiologist: Dr. Lovena Le  Discharge Diagnoses    Principal Problem:   Sick sinus syndrome [facility]) Active Problems:   Dizziness   Tobacco abuse   Sinus node dysfunction (HCC)   S/P placement of cardiac pacemaker  Diagnostic Studies/Procedures    1. PPM implantation 03/18/16 _____________     History of Present Illness     Ronald May is a 75 y.o. male with history of HLD, BPH, GERD, tobacco abuse who presented to [facility] with symptomatic bradycardia.   Hospital Course    For the last month he has had spells of dizziness, SOB, nausea, and pre-syncope. He presented to the ED for this and was found to have bradycardia into the upper 30s associated with these spells. He is not on any AVN blocking agents. He was seen by EP who recommended PPM placement. Labs revealed normal TSH, mild thrombocytopenia/anemia without prior to compare to. He underwent successful implantation of a St. Jude dual-chamber pacemaker for symptomatic bradycardia due to sinus node dysfunction without apparent complications. Standard pacemaker instructions were given to the patient and f/u arranged by the EP Team. There was a discrepancy in the follow-up listed on his paperwork- have sent a message to the EP NP who helped facilitate original dc to help clarify for the patient. They will call him. Post-op CXR without pneumothorax. He was advised to f/u PCP for his mildly decreased plt/Hgb. Dr. Curt Bears has seen and examined the patient today and feels he is stable for discharge.  _____________  Discharge Vitals Exam per Dr. Curt Bears: Vital Signs. BP 112/72 (BP Location: Left Arm)   Pulse 63   Temp 97.7 F (36.5 C) (Oral)   Resp 20   Wt 199 lb (90.3 kg)   SpO2 96%   BMI 25.55 kg/m  General: Well developed, well  nourished M, in no acute distress. Head: Normocephalic, atraumatic, sclera non-icteric, no xanthomas, nares are without discharge. Neck: Negative for carotid bruits. JVP not elevated. Lungs: Clear bilaterally to auscultation without wheezes, rales, or rhonchi. Breathing is unlabored. Heart: RRR S1 S2 without murmurs, rubs, or gallops. Pacemaker in place without apparent complication, hematoma or suppuration Abdomen: Soft, non-tender, non-distended with normoactive bowel sounds. No rebound/guarding. Extremities: No clubbing or cyanosis. No edema. Distal pedal pulses are 2+ and equal bilaterally. Neuro: Alert and oriented X 3. Moves all extremities spontaneously. Psych:  Responds to questions appropriately with a normal affect. Filed Weights   03/17/16 1602  Weight: 199 lb (90.3 kg)    Labs & Radiologic Studies    CBC  Recent Labs  03/17/16 1843 03/18/16 0310  WBC 10.1 9.4  HGB 11.6* 11.3*  HCT 35.7* 35.1*  MCV 93.2 93.4  PLT 118* [id number]*   Basic Metabolic Panel  Recent Labs  03/17/16 1843 03/18/16 0310  NA 135 140  K 4.1 4.4  CL 102 106  CO2 27 27  GLUCOSE 104* 101*  BUN 13 13  CREATININE 1.24 1.24  CALCIUM 9.1 9.3   Liver Function Tests  Recent Labs  03/17/16 1843  AST 16  ALT 16*  ALKPHOS 51  BILITOT 0.3  PROT 6.4*  ALBUMIN 3.6   Thyroid Function Tests  Recent Labs  03/17/16 1843  TSH 1.709   _____________  No results found. Disposition   Pt is being discharged home today  in good condition.  Follow-up Plans & Appointments    Follow-up Information    [place] [facility] Follow up on 03/31/2016.   Specialty:  Cardiology Why:  3:30PM, wound check Contact information: [address] [place]       Cristopher Peru, MD Follow up on 06/26/2016.   Specialty:  Cardiology Why:  9:00AM Contact information: [id number] [place] [address] [phone]         Neale Burly, MD Follow up.   Specialty:  Internal Medicine Why:  Your blood counts showed mildly decreased blood and platelet count. Please follow up with primary care doctor to review. Contact information: [place] [phone] [phone]          Discharge Instructions    Diet - low sodium heart healthy    Complete by:  As directed    Increase activity slowly    Complete by:  As directed    Please see attached sheet at the end of your After-Visit Summary for instructions on wound care, activity, and bathing.      Discharge Medications     Medication List    TAKE these medications   alfuzosin 10 MG 24 hr tablet Commonly known as:  UROXATRAL Take 10 mg by mouth daily with breakfast.   calcium-vitamin D 500-200 MG-UNIT tablet Commonly known as:  OSCAL WITH D Take 1 tablet by mouth 2 (two) times daily.   Fish Oil 1000 MG Caps Take 1 capsule by mouth 3 (three) times daily.   naproxen sodium 220 MG tablet Commonly known as:  ANAPROX Take 440 mg by mouth 2 (two) times daily as needed (for pain).   ranitidine 300 MG tablet Commonly known as:  ZANTAC Take 300 mg by mouth 2 (two) times daily.   simvastatin 40 MG tablet Commonly known as:  ZOCOR Take 40 mg by mouth daily.        Allergies:  No Known Allergies   Outstanding Labs/Studies   N/a  Duration of Discharge Encounter   Greater than 30 minutes including physician time.  Signed, Nedra Hai Dunn PA-C 03/19/2016, 9:00 AM   I have seen and examined this patient with Melina Copa.  Agree with above, note added to reflect my findings.  On exam, regular rhythm, no murmurs, lungs clear. Had pacemaker placed for sick sinus syndrome.  Interrogation and CXR without abnormalities.  Plan for follow up in device clinic in 10 days.    Will Curt Bears, MD 03/19/2016 10:19 PM

## 2016-03-30 DIAGNOSIS — D6959 Other secondary thrombocytopenia: Secondary | ICD-10-CM | POA: Diagnosis not present

## 2016-03-30 DIAGNOSIS — R008 Other abnormalities of heart beat: Secondary | ICD-10-CM | POA: Diagnosis not present

## 2016-03-31 ENCOUNTER — Ambulatory Visit (INDEPENDENT_AMBULATORY_CARE_PROVIDER_SITE_OTHER): Payer: Medicare Other | Admitting: *Deleted

## 2016-03-31 DIAGNOSIS — I495 Sick sinus syndrome: Secondary | ICD-10-CM | POA: Diagnosis not present

## 2016-03-31 DIAGNOSIS — Z950 Presence of cardiac pacemaker: Secondary | ICD-10-CM

## 2016-03-31 LAB — CUP PACEART INCLINIC DEVICE CHECK
Date Time Interrogation Session: 20171205171419
Implantable Lead Implant Date: 20171122
Implantable Lead Location: 753860
Implantable Pulse Generator Implant Date: 20171122
Lead Channel Pacing Threshold Amplitude: 0.375 V
Lead Channel Pacing Threshold Amplitude: 0.5 V
Lead Channel Sensing Intrinsic Amplitude: 12 mV
Lead Channel Setting Pacing Pulse Width: 0.4 ms
Lead Channel Setting Sensing Sensitivity: 2 mV
MDC IDC LEAD IMPLANT DT: 20171122
MDC IDC LEAD LOCATION: 753859
MDC IDC MSMT BATTERY VOLTAGE: 3.08 V
MDC IDC MSMT LEADCHNL RA IMPEDANCE VALUE: 512.5 Ohm
MDC IDC MSMT LEADCHNL RA PACING THRESHOLD PULSEWIDTH: 0.4 ms
MDC IDC MSMT LEADCHNL RA SENSING INTR AMPL: 3.2 mV
MDC IDC MSMT LEADCHNL RV IMPEDANCE VALUE: 587.5 Ohm
MDC IDC MSMT LEADCHNL RV PACING THRESHOLD PULSEWIDTH: 0.4 ms
MDC IDC SET LEADCHNL RA PACING AMPLITUDE: 1.375
MDC IDC SET LEADCHNL RV PACING AMPLITUDE: 3.5 V
MDC IDC STAT BRADY RA PERCENT PACED: 70 %
MDC IDC STAT BRADY RV PERCENT PACED: 6.6 %
Pulse Gen Model: 2272
Pulse Gen Serial Number: 3182714

## 2016-03-31 NOTE — Progress Notes (Signed)
Wound check appointment. Steri-strips removed. Wound without redness or edema. Incision edges approximated, wound well healed. Normal device function. Thresholds, sensing, and impedances consistent with implant measurements. Device output programmed at 3.5V (RV) with auto capture programmed on (RA) for extra safety margin until 3 month visit. Histogram distribution appropriate for patient and level of activity. 1 mode switch (<1%), AT, duration 6 sec. No high ventricular rates noted. Patient educated about wound care, arm mobility, lifting restrictions, and Merlin home monitor. ROV with GT on 06/29/16.

## 2016-04-14 DIAGNOSIS — H25811 Combined forms of age-related cataract, right eye: Secondary | ICD-10-CM | POA: Diagnosis not present

## 2016-04-14 DIAGNOSIS — H35371 Puckering of macula, right eye: Secondary | ICD-10-CM | POA: Diagnosis not present

## 2016-04-14 DIAGNOSIS — H2512 Age-related nuclear cataract, left eye: Secondary | ICD-10-CM | POA: Diagnosis not present

## 2016-05-11 DIAGNOSIS — N400 Enlarged prostate without lower urinary tract symptoms: Secondary | ICD-10-CM | POA: Diagnosis not present

## 2016-05-11 DIAGNOSIS — G458 Other transient cerebral ischemic attacks and related syndromes: Secondary | ICD-10-CM | POA: Diagnosis not present

## 2016-05-11 DIAGNOSIS — K210 Gastro-esophageal reflux disease with esophagitis: Secondary | ICD-10-CM | POA: Diagnosis not present

## 2016-05-11 DIAGNOSIS — H25811 Combined forms of age-related cataract, right eye: Secondary | ICD-10-CM | POA: Diagnosis not present

## 2016-05-15 DIAGNOSIS — R930 Abnormal findings on diagnostic imaging of skull and head, not elsewhere classified: Secondary | ICD-10-CM | POA: Diagnosis not present

## 2016-05-15 DIAGNOSIS — I6523 Occlusion and stenosis of bilateral carotid arteries: Secondary | ICD-10-CM | POA: Diagnosis not present

## 2016-05-15 DIAGNOSIS — R42 Dizziness and giddiness: Secondary | ICD-10-CM | POA: Diagnosis not present

## 2016-05-21 ENCOUNTER — Telehealth: Payer: Self-pay | Admitting: Cardiology

## 2016-05-21 DIAGNOSIS — R42 Dizziness and giddiness: Secondary | ICD-10-CM | POA: Diagnosis not present

## 2016-05-21 DIAGNOSIS — D496 Neoplasm of unspecified behavior of brain: Secondary | ICD-10-CM | POA: Diagnosis not present

## 2016-05-21 DIAGNOSIS — G458 Other transient cerebral ischemic attacks and related syndromes: Secondary | ICD-10-CM | POA: Diagnosis not present

## 2016-05-21 NOTE — Telephone Encounter (Signed)
Informed MRI tech w/ [facility] that we do not need to follow up w/ pt after having a MRI. She verbalized understanding.

## 2016-05-25 DIAGNOSIS — G939 Disorder of brain, unspecified: Secondary | ICD-10-CM | POA: Diagnosis not present

## 2016-05-25 DIAGNOSIS — R410 Disorientation, unspecified: Secondary | ICD-10-CM | POA: Diagnosis not present

## 2016-05-25 DIAGNOSIS — D496 Neoplasm of unspecified behavior of brain: Secondary | ICD-10-CM | POA: Diagnosis not present

## 2016-05-25 DIAGNOSIS — R42 Dizziness and giddiness: Secondary | ICD-10-CM | POA: Diagnosis not present

## 2016-05-28 DIAGNOSIS — N400 Enlarged prostate without lower urinary tract symptoms: Secondary | ICD-10-CM | POA: Diagnosis not present

## 2016-05-28 DIAGNOSIS — Z7689 Persons encountering health services in other specified circumstances: Secondary | ICD-10-CM | POA: Diagnosis not present

## 2016-05-28 DIAGNOSIS — I10 Essential (primary) hypertension: Secondary | ICD-10-CM | POA: Diagnosis not present

## 2016-05-28 DIAGNOSIS — K210 Gastro-esophageal reflux disease with esophagitis: Secondary | ICD-10-CM | POA: Diagnosis not present

## 2016-05-28 DIAGNOSIS — E784 Other hyperlipidemia: Secondary | ICD-10-CM | POA: Diagnosis not present

## 2016-05-28 DIAGNOSIS — Z9189 Other specified personal risk factors, not elsewhere classified: Secondary | ICD-10-CM | POA: Diagnosis not present

## 2016-05-28 DIAGNOSIS — C719 Malignant neoplasm of brain, unspecified: Secondary | ICD-10-CM | POA: Diagnosis not present

## 2016-05-28 DIAGNOSIS — R0683 Snoring: Secondary | ICD-10-CM | POA: Diagnosis not present

## 2016-05-29 DIAGNOSIS — G936 Cerebral edema: Secondary | ICD-10-CM | POA: Diagnosis present

## 2016-05-29 DIAGNOSIS — G4700 Insomnia, unspecified: Secondary | ICD-10-CM | POA: Diagnosis present

## 2016-05-29 DIAGNOSIS — F329 Major depressive disorder, single episode, unspecified: Secondary | ICD-10-CM | POA: Diagnosis present

## 2016-05-29 DIAGNOSIS — N401 Enlarged prostate with lower urinary tract symptoms: Secondary | ICD-10-CM | POA: Diagnosis present

## 2016-05-29 DIAGNOSIS — K219 Gastro-esophageal reflux disease without esophagitis: Secondary | ICD-10-CM | POA: Diagnosis present

## 2016-05-29 DIAGNOSIS — E785 Hyperlipidemia, unspecified: Secondary | ICD-10-CM | POA: Diagnosis present

## 2016-05-29 DIAGNOSIS — C719 Malignant neoplasm of brain, unspecified: Secondary | ICD-10-CM | POA: Diagnosis not present

## 2016-05-29 DIAGNOSIS — Z950 Presence of cardiac pacemaker: Secondary | ICD-10-CM | POA: Diagnosis not present

## 2016-05-29 DIAGNOSIS — C712 Malignant neoplasm of temporal lobe: Secondary | ICD-10-CM | POA: Diagnosis present

## 2016-05-29 DIAGNOSIS — M1990 Unspecified osteoarthritis, unspecified site: Secondary | ICD-10-CM | POA: Diagnosis present

## 2016-05-29 DIAGNOSIS — Z87891 Personal history of nicotine dependence: Secondary | ICD-10-CM | POA: Diagnosis not present

## 2016-05-29 DIAGNOSIS — F419 Anxiety disorder, unspecified: Secondary | ICD-10-CM | POA: Diagnosis present

## 2016-06-04 DIAGNOSIS — C712 Malignant neoplasm of temporal lobe: Secondary | ICD-10-CM | POA: Diagnosis not present

## 2016-06-04 DIAGNOSIS — Z87891 Personal history of nicotine dependence: Secondary | ICD-10-CM | POA: Diagnosis not present

## 2016-06-04 DIAGNOSIS — K5900 Constipation, unspecified: Secondary | ICD-10-CM | POA: Diagnosis not present

## 2016-06-04 DIAGNOSIS — Z950 Presence of cardiac pacemaker: Secondary | ICD-10-CM | POA: Diagnosis not present

## 2016-06-04 DIAGNOSIS — H269 Unspecified cataract: Secondary | ICD-10-CM | POA: Diagnosis not present

## 2016-06-04 DIAGNOSIS — C719 Malignant neoplasm of brain, unspecified: Secondary | ICD-10-CM | POA: Diagnosis not present

## 2016-06-04 DIAGNOSIS — N400 Enlarged prostate without lower urinary tract symptoms: Secondary | ICD-10-CM | POA: Diagnosis not present

## 2016-06-04 DIAGNOSIS — Z720 Tobacco use: Secondary | ICD-10-CM | POA: Diagnosis not present

## 2016-06-04 DIAGNOSIS — I495 Sick sinus syndrome: Secondary | ICD-10-CM | POA: Diagnosis not present

## 2016-06-08 DIAGNOSIS — F411 Generalized anxiety disorder: Secondary | ICD-10-CM | POA: Diagnosis not present

## 2016-06-08 DIAGNOSIS — C718 Malignant neoplasm of overlapping sites of brain: Secondary | ICD-10-CM | POA: Diagnosis not present

## 2016-06-10 DIAGNOSIS — R42 Dizziness and giddiness: Secondary | ICD-10-CM | POA: Diagnosis not present

## 2016-06-10 DIAGNOSIS — R350 Frequency of micturition: Secondary | ICD-10-CM | POA: Diagnosis not present

## 2016-06-10 DIAGNOSIS — K219 Gastro-esophageal reflux disease without esophagitis: Secondary | ICD-10-CM | POA: Diagnosis not present

## 2016-06-10 DIAGNOSIS — Z87891 Personal history of nicotine dependence: Secondary | ICD-10-CM | POA: Diagnosis not present

## 2016-06-10 DIAGNOSIS — M1990 Unspecified osteoarthritis, unspecified site: Secondary | ICD-10-CM | POA: Diagnosis not present

## 2016-06-10 DIAGNOSIS — I495 Sick sinus syndrome: Secondary | ICD-10-CM | POA: Diagnosis not present

## 2016-06-10 DIAGNOSIS — R410 Disorientation, unspecified: Secondary | ICD-10-CM | POA: Diagnosis not present

## 2016-06-10 DIAGNOSIS — I2510 Atherosclerotic heart disease of native coronary artery without angina pectoris: Secondary | ICD-10-CM | POA: Diagnosis not present

## 2016-06-10 DIAGNOSIS — H269 Unspecified cataract: Secondary | ICD-10-CM | POA: Diagnosis not present

## 2016-06-10 DIAGNOSIS — G4700 Insomnia, unspecified: Secondary | ICD-10-CM | POA: Diagnosis not present

## 2016-06-10 DIAGNOSIS — R05 Cough: Secondary | ICD-10-CM | POA: Diagnosis not present

## 2016-06-10 DIAGNOSIS — N400 Enlarged prostate without lower urinary tract symptoms: Secondary | ICD-10-CM | POA: Diagnosis not present

## 2016-06-10 DIAGNOSIS — D649 Anemia, unspecified: Secondary | ICD-10-CM | POA: Diagnosis not present

## 2016-06-10 DIAGNOSIS — E785 Hyperlipidemia, unspecified: Secondary | ICD-10-CM | POA: Diagnosis not present

## 2016-06-10 DIAGNOSIS — Z950 Presence of cardiac pacemaker: Secondary | ICD-10-CM | POA: Diagnosis not present

## 2016-06-10 DIAGNOSIS — D696 Thrombocytopenia, unspecified: Secondary | ICD-10-CM | POA: Diagnosis not present

## 2016-06-10 DIAGNOSIS — C712 Malignant neoplasm of temporal lobe: Secondary | ICD-10-CM | POA: Diagnosis not present

## 2016-06-10 DIAGNOSIS — F419 Anxiety disorder, unspecified: Secondary | ICD-10-CM | POA: Diagnosis not present

## 2016-06-15 DIAGNOSIS — F17200 Nicotine dependence, unspecified, uncomplicated: Secondary | ICD-10-CM | POA: Diagnosis not present

## 2016-06-15 DIAGNOSIS — I495 Sick sinus syndrome: Secondary | ICD-10-CM | POA: Diagnosis not present

## 2016-06-15 DIAGNOSIS — Z950 Presence of cardiac pacemaker: Secondary | ICD-10-CM | POA: Diagnosis not present

## 2016-06-15 DIAGNOSIS — Z720 Tobacco use: Secondary | ICD-10-CM | POA: Diagnosis not present

## 2016-06-15 DIAGNOSIS — K5900 Constipation, unspecified: Secondary | ICD-10-CM | POA: Diagnosis not present

## 2016-06-15 DIAGNOSIS — F419 Anxiety disorder, unspecified: Secondary | ICD-10-CM | POA: Diagnosis not present

## 2016-06-15 DIAGNOSIS — N400 Enlarged prostate without lower urinary tract symptoms: Secondary | ICD-10-CM | POA: Diagnosis not present

## 2016-06-15 DIAGNOSIS — C719 Malignant neoplasm of brain, unspecified: Secondary | ICD-10-CM | POA: Diagnosis not present

## 2016-06-18 DIAGNOSIS — R42 Dizziness and giddiness: Secondary | ICD-10-CM | POA: Diagnosis not present

## 2016-06-18 DIAGNOSIS — I495 Sick sinus syndrome: Secondary | ICD-10-CM | POA: Diagnosis not present

## 2016-06-18 DIAGNOSIS — C712 Malignant neoplasm of temporal lobe: Secondary | ICD-10-CM | POA: Diagnosis not present

## 2016-06-18 DIAGNOSIS — R410 Disorientation, unspecified: Secondary | ICD-10-CM | POA: Diagnosis not present

## 2016-06-18 DIAGNOSIS — D696 Thrombocytopenia, unspecified: Secondary | ICD-10-CM | POA: Diagnosis not present

## 2016-06-18 DIAGNOSIS — N400 Enlarged prostate without lower urinary tract symptoms: Secondary | ICD-10-CM | POA: Diagnosis not present

## 2016-06-26 ENCOUNTER — Encounter: Payer: 59 | Admitting: Internal Medicine

## 2016-06-29 ENCOUNTER — Encounter: Payer: 59 | Admitting: Internal Medicine

## 2016-06-29 DIAGNOSIS — C719 Malignant neoplasm of brain, unspecified: Secondary | ICD-10-CM | POA: Diagnosis not present

## 2016-06-29 DIAGNOSIS — Z9889 Other specified postprocedural states: Secondary | ICD-10-CM | POA: Diagnosis not present

## 2016-06-30 DIAGNOSIS — C719 Malignant neoplasm of brain, unspecified: Secondary | ICD-10-CM | POA: Diagnosis not present

## 2016-06-30 DIAGNOSIS — Z510 Encounter for antineoplastic radiation therapy: Secondary | ICD-10-CM | POA: Diagnosis not present

## 2016-06-30 DIAGNOSIS — C712 Malignant neoplasm of temporal lobe: Secondary | ICD-10-CM | POA: Diagnosis not present

## 2016-07-02 DIAGNOSIS — C712 Malignant neoplasm of temporal lobe: Secondary | ICD-10-CM | POA: Diagnosis not present

## 2016-07-02 DIAGNOSIS — Z510 Encounter for antineoplastic radiation therapy: Secondary | ICD-10-CM | POA: Diagnosis not present

## 2016-07-02 DIAGNOSIS — C719 Malignant neoplasm of brain, unspecified: Secondary | ICD-10-CM | POA: Diagnosis not present

## 2016-07-03 DIAGNOSIS — Z510 Encounter for antineoplastic radiation therapy: Secondary | ICD-10-CM | POA: Diagnosis not present

## 2016-07-03 DIAGNOSIS — F411 Generalized anxiety disorder: Secondary | ICD-10-CM | POA: Diagnosis not present

## 2016-07-03 DIAGNOSIS — J440 Chronic obstructive pulmonary disease with acute lower respiratory infection: Secondary | ICD-10-CM | POA: Diagnosis not present

## 2016-07-03 DIAGNOSIS — C719 Malignant neoplasm of brain, unspecified: Secondary | ICD-10-CM | POA: Diagnosis not present

## 2016-07-03 DIAGNOSIS — C712 Malignant neoplasm of temporal lobe: Secondary | ICD-10-CM | POA: Diagnosis not present

## 2016-07-03 DIAGNOSIS — C718 Malignant neoplasm of overlapping sites of brain: Secondary | ICD-10-CM | POA: Diagnosis not present

## 2016-07-06 DIAGNOSIS — Z510 Encounter for antineoplastic radiation therapy: Secondary | ICD-10-CM | POA: Diagnosis not present

## 2016-07-06 DIAGNOSIS — C712 Malignant neoplasm of temporal lobe: Secondary | ICD-10-CM | POA: Diagnosis not present

## 2016-07-06 DIAGNOSIS — C719 Malignant neoplasm of brain, unspecified: Secondary | ICD-10-CM | POA: Diagnosis not present

## 2016-07-07 DIAGNOSIS — C719 Malignant neoplasm of brain, unspecified: Secondary | ICD-10-CM | POA: Diagnosis not present

## 2016-07-07 DIAGNOSIS — Z510 Encounter for antineoplastic radiation therapy: Secondary | ICD-10-CM | POA: Diagnosis not present

## 2016-07-07 DIAGNOSIS — C712 Malignant neoplasm of temporal lobe: Secondary | ICD-10-CM | POA: Diagnosis not present

## 2016-07-08 DIAGNOSIS — Z510 Encounter for antineoplastic radiation therapy: Secondary | ICD-10-CM | POA: Diagnosis not present

## 2016-07-08 DIAGNOSIS — C719 Malignant neoplasm of brain, unspecified: Secondary | ICD-10-CM | POA: Diagnosis not present

## 2016-07-08 DIAGNOSIS — C712 Malignant neoplasm of temporal lobe: Secondary | ICD-10-CM | POA: Diagnosis not present

## 2016-07-09 DIAGNOSIS — Z510 Encounter for antineoplastic radiation therapy: Secondary | ICD-10-CM | POA: Diagnosis not present

## 2016-07-09 DIAGNOSIS — C712 Malignant neoplasm of temporal lobe: Secondary | ICD-10-CM | POA: Diagnosis not present

## 2016-07-09 DIAGNOSIS — C719 Malignant neoplasm of brain, unspecified: Secondary | ICD-10-CM | POA: Diagnosis not present

## 2016-07-10 DIAGNOSIS — C712 Malignant neoplasm of temporal lobe: Secondary | ICD-10-CM | POA: Diagnosis not present

## 2016-07-10 DIAGNOSIS — C719 Malignant neoplasm of brain, unspecified: Secondary | ICD-10-CM | POA: Diagnosis not present

## 2016-07-10 DIAGNOSIS — Z510 Encounter for antineoplastic radiation therapy: Secondary | ICD-10-CM | POA: Diagnosis not present

## 2016-07-13 DIAGNOSIS — Z510 Encounter for antineoplastic radiation therapy: Secondary | ICD-10-CM | POA: Diagnosis not present

## 2016-07-13 DIAGNOSIS — C719 Malignant neoplasm of brain, unspecified: Secondary | ICD-10-CM | POA: Diagnosis not present

## 2016-07-13 DIAGNOSIS — C712 Malignant neoplasm of temporal lobe: Secondary | ICD-10-CM | POA: Diagnosis not present

## 2016-07-14 DIAGNOSIS — Z510 Encounter for antineoplastic radiation therapy: Secondary | ICD-10-CM | POA: Diagnosis not present

## 2016-07-14 DIAGNOSIS — C712 Malignant neoplasm of temporal lobe: Secondary | ICD-10-CM | POA: Diagnosis not present

## 2016-07-14 DIAGNOSIS — C719 Malignant neoplasm of brain, unspecified: Secondary | ICD-10-CM | POA: Diagnosis not present

## 2016-07-15 DIAGNOSIS — Z7689 Persons encountering health services in other specified circumstances: Secondary | ICD-10-CM | POA: Diagnosis not present

## 2016-07-15 DIAGNOSIS — Z510 Encounter for antineoplastic radiation therapy: Secondary | ICD-10-CM | POA: Diagnosis not present

## 2016-07-15 DIAGNOSIS — F17200 Nicotine dependence, unspecified, uncomplicated: Secondary | ICD-10-CM | POA: Diagnosis not present

## 2016-07-15 DIAGNOSIS — F17210 Nicotine dependence, cigarettes, uncomplicated: Secondary | ICD-10-CM | POA: Diagnosis not present

## 2016-07-15 DIAGNOSIS — R0683 Snoring: Secondary | ICD-10-CM | POA: Diagnosis not present

## 2016-07-15 DIAGNOSIS — C719 Malignant neoplasm of brain, unspecified: Secondary | ICD-10-CM | POA: Diagnosis not present

## 2016-07-15 DIAGNOSIS — C712 Malignant neoplasm of temporal lobe: Secondary | ICD-10-CM | POA: Diagnosis not present

## 2016-07-15 DIAGNOSIS — Z8679 Personal history of other diseases of the circulatory system: Secondary | ICD-10-CM | POA: Diagnosis not present

## 2016-07-16 DIAGNOSIS — F419 Anxiety disorder, unspecified: Secondary | ICD-10-CM | POA: Diagnosis not present

## 2016-07-16 DIAGNOSIS — Z510 Encounter for antineoplastic radiation therapy: Secondary | ICD-10-CM | POA: Diagnosis not present

## 2016-07-16 DIAGNOSIS — Z950 Presence of cardiac pacemaker: Secondary | ICD-10-CM | POA: Diagnosis not present

## 2016-07-16 DIAGNOSIS — N400 Enlarged prostate without lower urinary tract symptoms: Secondary | ICD-10-CM | POA: Diagnosis not present

## 2016-07-16 DIAGNOSIS — R350 Frequency of micturition: Secondary | ICD-10-CM | POA: Diagnosis not present

## 2016-07-16 DIAGNOSIS — Z79899 Other long term (current) drug therapy: Secondary | ICD-10-CM | POA: Diagnosis not present

## 2016-07-16 DIAGNOSIS — C712 Malignant neoplasm of temporal lobe: Secondary | ICD-10-CM | POA: Diagnosis not present

## 2016-07-16 DIAGNOSIS — R351 Nocturia: Secondary | ICD-10-CM | POA: Diagnosis not present

## 2016-07-16 DIAGNOSIS — K219 Gastro-esophageal reflux disease without esophagitis: Secondary | ICD-10-CM | POA: Diagnosis not present

## 2016-07-16 DIAGNOSIS — N401 Enlarged prostate with lower urinary tract symptoms: Secondary | ICD-10-CM | POA: Diagnosis not present

## 2016-07-16 DIAGNOSIS — Z720 Tobacco use: Secondary | ICD-10-CM | POA: Diagnosis not present

## 2016-07-16 DIAGNOSIS — K5900 Constipation, unspecified: Secondary | ICD-10-CM | POA: Diagnosis not present

## 2016-07-16 DIAGNOSIS — I495 Sick sinus syndrome: Secondary | ICD-10-CM | POA: Diagnosis not present

## 2016-07-16 DIAGNOSIS — C719 Malignant neoplasm of brain, unspecified: Secondary | ICD-10-CM | POA: Diagnosis not present

## 2016-07-17 DIAGNOSIS — Z510 Encounter for antineoplastic radiation therapy: Secondary | ICD-10-CM | POA: Diagnosis not present

## 2016-07-17 DIAGNOSIS — C719 Malignant neoplasm of brain, unspecified: Secondary | ICD-10-CM | POA: Diagnosis not present

## 2016-07-17 DIAGNOSIS — C712 Malignant neoplasm of temporal lobe: Secondary | ICD-10-CM | POA: Diagnosis not present

## 2016-07-20 DIAGNOSIS — C712 Malignant neoplasm of temporal lobe: Secondary | ICD-10-CM | POA: Diagnosis not present

## 2016-07-20 DIAGNOSIS — Z510 Encounter for antineoplastic radiation therapy: Secondary | ICD-10-CM | POA: Diagnosis not present

## 2016-07-20 DIAGNOSIS — C719 Malignant neoplasm of brain, unspecified: Secondary | ICD-10-CM | POA: Diagnosis not present

## 2016-07-21 DIAGNOSIS — Z510 Encounter for antineoplastic radiation therapy: Secondary | ICD-10-CM | POA: Diagnosis not present

## 2016-07-21 DIAGNOSIS — C712 Malignant neoplasm of temporal lobe: Secondary | ICD-10-CM | POA: Diagnosis not present

## 2016-07-21 DIAGNOSIS — C719 Malignant neoplasm of brain, unspecified: Secondary | ICD-10-CM | POA: Diagnosis not present

## 2016-07-22 ENCOUNTER — Ambulatory Visit: Payer: Medicare Other | Admitting: "Endocrinology

## 2016-07-22 DIAGNOSIS — C719 Malignant neoplasm of brain, unspecified: Secondary | ICD-10-CM | POA: Diagnosis not present

## 2016-07-22 DIAGNOSIS — C712 Malignant neoplasm of temporal lobe: Secondary | ICD-10-CM | POA: Diagnosis not present

## 2016-07-22 DIAGNOSIS — Z510 Encounter for antineoplastic radiation therapy: Secondary | ICD-10-CM | POA: Diagnosis not present

## 2016-07-23 DIAGNOSIS — C719 Malignant neoplasm of brain, unspecified: Secondary | ICD-10-CM | POA: Diagnosis not present

## 2016-07-23 DIAGNOSIS — C712 Malignant neoplasm of temporal lobe: Secondary | ICD-10-CM | POA: Diagnosis not present

## 2016-07-23 DIAGNOSIS — Z510 Encounter for antineoplastic radiation therapy: Secondary | ICD-10-CM | POA: Diagnosis not present

## 2016-07-27 DIAGNOSIS — Z510 Encounter for antineoplastic radiation therapy: Secondary | ICD-10-CM | POA: Diagnosis not present

## 2016-07-27 DIAGNOSIS — K219 Gastro-esophageal reflux disease without esophagitis: Secondary | ICD-10-CM | POA: Diagnosis not present

## 2016-07-27 DIAGNOSIS — Z836 Family history of other diseases of the respiratory system: Secondary | ICD-10-CM | POA: Diagnosis not present

## 2016-07-27 DIAGNOSIS — Z800 Family history of malignant neoplasm of digestive organs: Secondary | ICD-10-CM | POA: Diagnosis not present

## 2016-07-27 DIAGNOSIS — Z79899 Other long term (current) drug therapy: Secondary | ICD-10-CM | POA: Diagnosis not present

## 2016-07-27 DIAGNOSIS — Z87891 Personal history of nicotine dependence: Secondary | ICD-10-CM | POA: Diagnosis not present

## 2016-07-27 DIAGNOSIS — Z8249 Family history of ischemic heart disease and other diseases of the circulatory system: Secondary | ICD-10-CM | POA: Diagnosis not present

## 2016-07-27 DIAGNOSIS — C719 Malignant neoplasm of brain, unspecified: Secondary | ICD-10-CM | POA: Diagnosis not present

## 2016-07-27 DIAGNOSIS — I2510 Atherosclerotic heart disease of native coronary artery without angina pectoris: Secondary | ICD-10-CM | POA: Diagnosis not present

## 2016-07-27 DIAGNOSIS — N400 Enlarged prostate without lower urinary tract symptoms: Secondary | ICD-10-CM | POA: Diagnosis not present

## 2016-07-27 DIAGNOSIS — C712 Malignant neoplasm of temporal lobe: Secondary | ICD-10-CM | POA: Diagnosis not present

## 2016-07-28 DIAGNOSIS — I2510 Atherosclerotic heart disease of native coronary artery without angina pectoris: Secondary | ICD-10-CM | POA: Diagnosis not present

## 2016-07-28 DIAGNOSIS — C719 Malignant neoplasm of brain, unspecified: Secondary | ICD-10-CM | POA: Diagnosis not present

## 2016-07-28 DIAGNOSIS — Z8249 Family history of ischemic heart disease and other diseases of the circulatory system: Secondary | ICD-10-CM | POA: Diagnosis not present

## 2016-07-28 DIAGNOSIS — C712 Malignant neoplasm of temporal lobe: Secondary | ICD-10-CM | POA: Diagnosis not present

## 2016-07-28 DIAGNOSIS — Z510 Encounter for antineoplastic radiation therapy: Secondary | ICD-10-CM | POA: Diagnosis not present

## 2016-07-28 DIAGNOSIS — K219 Gastro-esophageal reflux disease without esophagitis: Secondary | ICD-10-CM | POA: Diagnosis not present

## 2016-07-28 DIAGNOSIS — N400 Enlarged prostate without lower urinary tract symptoms: Secondary | ICD-10-CM | POA: Diagnosis not present

## 2016-07-29 DIAGNOSIS — K219 Gastro-esophageal reflux disease without esophagitis: Secondary | ICD-10-CM | POA: Diagnosis not present

## 2016-07-29 DIAGNOSIS — Z510 Encounter for antineoplastic radiation therapy: Secondary | ICD-10-CM | POA: Diagnosis not present

## 2016-07-29 DIAGNOSIS — C712 Malignant neoplasm of temporal lobe: Secondary | ICD-10-CM | POA: Diagnosis not present

## 2016-07-29 DIAGNOSIS — N400 Enlarged prostate without lower urinary tract symptoms: Secondary | ICD-10-CM | POA: Diagnosis not present

## 2016-07-29 DIAGNOSIS — I2510 Atherosclerotic heart disease of native coronary artery without angina pectoris: Secondary | ICD-10-CM | POA: Diagnosis not present

## 2016-07-29 DIAGNOSIS — C719 Malignant neoplasm of brain, unspecified: Secondary | ICD-10-CM | POA: Diagnosis not present

## 2016-07-29 DIAGNOSIS — Z8249 Family history of ischemic heart disease and other diseases of the circulatory system: Secondary | ICD-10-CM | POA: Diagnosis not present

## 2016-07-30 DIAGNOSIS — Z510 Encounter for antineoplastic radiation therapy: Secondary | ICD-10-CM | POA: Diagnosis not present

## 2016-07-30 DIAGNOSIS — C719 Malignant neoplasm of brain, unspecified: Secondary | ICD-10-CM | POA: Diagnosis not present

## 2016-07-30 DIAGNOSIS — K219 Gastro-esophageal reflux disease without esophagitis: Secondary | ICD-10-CM | POA: Diagnosis not present

## 2016-07-30 DIAGNOSIS — Z8249 Family history of ischemic heart disease and other diseases of the circulatory system: Secondary | ICD-10-CM | POA: Diagnosis not present

## 2016-07-30 DIAGNOSIS — N400 Enlarged prostate without lower urinary tract symptoms: Secondary | ICD-10-CM | POA: Diagnosis not present

## 2016-07-30 DIAGNOSIS — I2510 Atherosclerotic heart disease of native coronary artery without angina pectoris: Secondary | ICD-10-CM | POA: Diagnosis not present

## 2016-07-30 DIAGNOSIS — C712 Malignant neoplasm of temporal lobe: Secondary | ICD-10-CM | POA: Diagnosis not present

## 2016-07-31 DIAGNOSIS — C712 Malignant neoplasm of temporal lobe: Secondary | ICD-10-CM | POA: Diagnosis not present

## 2016-07-31 DIAGNOSIS — I2510 Atherosclerotic heart disease of native coronary artery without angina pectoris: Secondary | ICD-10-CM | POA: Diagnosis not present

## 2016-07-31 DIAGNOSIS — C719 Malignant neoplasm of brain, unspecified: Secondary | ICD-10-CM | POA: Diagnosis not present

## 2016-07-31 DIAGNOSIS — Z510 Encounter for antineoplastic radiation therapy: Secondary | ICD-10-CM | POA: Diagnosis not present

## 2016-07-31 DIAGNOSIS — K219 Gastro-esophageal reflux disease without esophagitis: Secondary | ICD-10-CM | POA: Diagnosis not present

## 2016-07-31 DIAGNOSIS — N400 Enlarged prostate without lower urinary tract symptoms: Secondary | ICD-10-CM | POA: Diagnosis not present

## 2016-07-31 DIAGNOSIS — Z8249 Family history of ischemic heart disease and other diseases of the circulatory system: Secondary | ICD-10-CM | POA: Diagnosis not present

## 2016-08-03 DIAGNOSIS — N400 Enlarged prostate without lower urinary tract symptoms: Secondary | ICD-10-CM | POA: Diagnosis not present

## 2016-08-03 DIAGNOSIS — C719 Malignant neoplasm of brain, unspecified: Secondary | ICD-10-CM | POA: Diagnosis not present

## 2016-08-03 DIAGNOSIS — Z510 Encounter for antineoplastic radiation therapy: Secondary | ICD-10-CM | POA: Diagnosis not present

## 2016-08-03 DIAGNOSIS — K219 Gastro-esophageal reflux disease without esophagitis: Secondary | ICD-10-CM | POA: Diagnosis not present

## 2016-08-03 DIAGNOSIS — C712 Malignant neoplasm of temporal lobe: Secondary | ICD-10-CM | POA: Diagnosis not present

## 2016-08-03 DIAGNOSIS — I2510 Atherosclerotic heart disease of native coronary artery without angina pectoris: Secondary | ICD-10-CM | POA: Diagnosis not present

## 2016-08-03 DIAGNOSIS — Z8249 Family history of ischemic heart disease and other diseases of the circulatory system: Secondary | ICD-10-CM | POA: Diagnosis not present

## 2016-08-04 DIAGNOSIS — Z510 Encounter for antineoplastic radiation therapy: Secondary | ICD-10-CM | POA: Diagnosis not present

## 2016-08-04 DIAGNOSIS — C712 Malignant neoplasm of temporal lobe: Secondary | ICD-10-CM | POA: Diagnosis not present

## 2016-08-04 DIAGNOSIS — K219 Gastro-esophageal reflux disease without esophagitis: Secondary | ICD-10-CM | POA: Diagnosis not present

## 2016-08-04 DIAGNOSIS — C719 Malignant neoplasm of brain, unspecified: Secondary | ICD-10-CM | POA: Diagnosis not present

## 2016-08-04 DIAGNOSIS — Z8249 Family history of ischemic heart disease and other diseases of the circulatory system: Secondary | ICD-10-CM | POA: Diagnosis not present

## 2016-08-04 DIAGNOSIS — I2510 Atherosclerotic heart disease of native coronary artery without angina pectoris: Secondary | ICD-10-CM | POA: Diagnosis not present

## 2016-08-04 DIAGNOSIS — N400 Enlarged prostate without lower urinary tract symptoms: Secondary | ICD-10-CM | POA: Diagnosis not present

## 2016-08-05 DIAGNOSIS — Z8249 Family history of ischemic heart disease and other diseases of the circulatory system: Secondary | ICD-10-CM | POA: Diagnosis not present

## 2016-08-05 DIAGNOSIS — I2510 Atherosclerotic heart disease of native coronary artery without angina pectoris: Secondary | ICD-10-CM | POA: Diagnosis not present

## 2016-08-05 DIAGNOSIS — Z510 Encounter for antineoplastic radiation therapy: Secondary | ICD-10-CM | POA: Diagnosis not present

## 2016-08-05 DIAGNOSIS — C712 Malignant neoplasm of temporal lobe: Secondary | ICD-10-CM | POA: Diagnosis not present

## 2016-08-05 DIAGNOSIS — N400 Enlarged prostate without lower urinary tract symptoms: Secondary | ICD-10-CM | POA: Diagnosis not present

## 2016-08-05 DIAGNOSIS — C719 Malignant neoplasm of brain, unspecified: Secondary | ICD-10-CM | POA: Diagnosis not present

## 2016-08-05 DIAGNOSIS — K219 Gastro-esophageal reflux disease without esophagitis: Secondary | ICD-10-CM | POA: Diagnosis not present

## 2016-08-06 DIAGNOSIS — Z510 Encounter for antineoplastic radiation therapy: Secondary | ICD-10-CM | POA: Diagnosis not present

## 2016-08-06 DIAGNOSIS — N400 Enlarged prostate without lower urinary tract symptoms: Secondary | ICD-10-CM | POA: Diagnosis not present

## 2016-08-06 DIAGNOSIS — K219 Gastro-esophageal reflux disease without esophagitis: Secondary | ICD-10-CM | POA: Diagnosis not present

## 2016-08-06 DIAGNOSIS — Z8249 Family history of ischemic heart disease and other diseases of the circulatory system: Secondary | ICD-10-CM | POA: Diagnosis not present

## 2016-08-06 DIAGNOSIS — C712 Malignant neoplasm of temporal lobe: Secondary | ICD-10-CM | POA: Diagnosis not present

## 2016-08-06 DIAGNOSIS — C719 Malignant neoplasm of brain, unspecified: Secondary | ICD-10-CM | POA: Diagnosis not present

## 2016-08-06 DIAGNOSIS — I2510 Atherosclerotic heart disease of native coronary artery without angina pectoris: Secondary | ICD-10-CM | POA: Diagnosis not present

## 2016-08-07 DIAGNOSIS — K219 Gastro-esophageal reflux disease without esophagitis: Secondary | ICD-10-CM | POA: Diagnosis not present

## 2016-08-07 DIAGNOSIS — N400 Enlarged prostate without lower urinary tract symptoms: Secondary | ICD-10-CM | POA: Diagnosis not present

## 2016-08-07 DIAGNOSIS — Z510 Encounter for antineoplastic radiation therapy: Secondary | ICD-10-CM | POA: Diagnosis not present

## 2016-08-07 DIAGNOSIS — C719 Malignant neoplasm of brain, unspecified: Secondary | ICD-10-CM | POA: Diagnosis not present

## 2016-08-07 DIAGNOSIS — I2510 Atherosclerotic heart disease of native coronary artery without angina pectoris: Secondary | ICD-10-CM | POA: Diagnosis not present

## 2016-08-07 DIAGNOSIS — Z8249 Family history of ischemic heart disease and other diseases of the circulatory system: Secondary | ICD-10-CM | POA: Diagnosis not present

## 2016-08-10 DIAGNOSIS — I2510 Atherosclerotic heart disease of native coronary artery without angina pectoris: Secondary | ICD-10-CM | POA: Diagnosis not present

## 2016-08-10 DIAGNOSIS — K219 Gastro-esophageal reflux disease without esophagitis: Secondary | ICD-10-CM | POA: Diagnosis not present

## 2016-08-10 DIAGNOSIS — Z8249 Family history of ischemic heart disease and other diseases of the circulatory system: Secondary | ICD-10-CM | POA: Diagnosis not present

## 2016-08-10 DIAGNOSIS — Z510 Encounter for antineoplastic radiation therapy: Secondary | ICD-10-CM | POA: Diagnosis not present

## 2016-08-10 DIAGNOSIS — N400 Enlarged prostate without lower urinary tract symptoms: Secondary | ICD-10-CM | POA: Diagnosis not present

## 2016-08-10 DIAGNOSIS — C719 Malignant neoplasm of brain, unspecified: Secondary | ICD-10-CM | POA: Diagnosis not present

## 2016-08-13 DIAGNOSIS — Z87448 Personal history of other diseases of urinary system: Secondary | ICD-10-CM | POA: Diagnosis not present

## 2016-08-13 DIAGNOSIS — Z950 Presence of cardiac pacemaker: Secondary | ICD-10-CM | POA: Diagnosis not present

## 2016-08-13 DIAGNOSIS — Z8679 Personal history of other diseases of the circulatory system: Secondary | ICD-10-CM | POA: Diagnosis not present

## 2016-08-13 DIAGNOSIS — Z79899 Other long term (current) drug therapy: Secondary | ICD-10-CM | POA: Diagnosis not present

## 2016-08-13 DIAGNOSIS — R197 Diarrhea, unspecified: Secondary | ICD-10-CM | POA: Diagnosis not present

## 2016-08-13 DIAGNOSIS — D696 Thrombocytopenia, unspecified: Secondary | ICD-10-CM | POA: Diagnosis not present

## 2016-08-13 DIAGNOSIS — C719 Malignant neoplasm of brain, unspecified: Secondary | ICD-10-CM | POA: Diagnosis not present

## 2016-08-13 DIAGNOSIS — Z8669 Personal history of other diseases of the nervous system and sense organs: Secondary | ICD-10-CM | POA: Diagnosis not present

## 2016-08-13 DIAGNOSIS — K219 Gastro-esophageal reflux disease without esophagitis: Secondary | ICD-10-CM | POA: Diagnosis not present

## 2016-08-17 DIAGNOSIS — N400 Enlarged prostate without lower urinary tract symptoms: Secondary | ICD-10-CM | POA: Diagnosis not present

## 2016-08-17 DIAGNOSIS — C712 Malignant neoplasm of temporal lobe: Secondary | ICD-10-CM | POA: Diagnosis not present

## 2016-08-17 DIAGNOSIS — I2510 Atherosclerotic heart disease of native coronary artery without angina pectoris: Secondary | ICD-10-CM | POA: Diagnosis not present

## 2016-08-17 DIAGNOSIS — Z8249 Family history of ischemic heart disease and other diseases of the circulatory system: Secondary | ICD-10-CM | POA: Diagnosis not present

## 2016-08-17 DIAGNOSIS — C719 Malignant neoplasm of brain, unspecified: Secondary | ICD-10-CM | POA: Diagnosis not present

## 2016-08-17 DIAGNOSIS — K219 Gastro-esophageal reflux disease without esophagitis: Secondary | ICD-10-CM | POA: Diagnosis not present

## 2016-08-17 DIAGNOSIS — Z510 Encounter for antineoplastic radiation therapy: Secondary | ICD-10-CM | POA: Diagnosis not present

## 2016-08-18 DIAGNOSIS — Z8249 Family history of ischemic heart disease and other diseases of the circulatory system: Secondary | ICD-10-CM | POA: Diagnosis not present

## 2016-08-18 DIAGNOSIS — N400 Enlarged prostate without lower urinary tract symptoms: Secondary | ICD-10-CM | POA: Diagnosis not present

## 2016-08-18 DIAGNOSIS — I2510 Atherosclerotic heart disease of native coronary artery without angina pectoris: Secondary | ICD-10-CM | POA: Diagnosis not present

## 2016-08-18 DIAGNOSIS — C712 Malignant neoplasm of temporal lobe: Secondary | ICD-10-CM | POA: Diagnosis not present

## 2016-08-18 DIAGNOSIS — C719 Malignant neoplasm of brain, unspecified: Secondary | ICD-10-CM | POA: Diagnosis not present

## 2016-08-18 DIAGNOSIS — K219 Gastro-esophageal reflux disease without esophagitis: Secondary | ICD-10-CM | POA: Diagnosis not present

## 2016-08-18 DIAGNOSIS — Z510 Encounter for antineoplastic radiation therapy: Secondary | ICD-10-CM | POA: Diagnosis not present

## 2016-08-19 DIAGNOSIS — Z8249 Family history of ischemic heart disease and other diseases of the circulatory system: Secondary | ICD-10-CM | POA: Diagnosis not present

## 2016-08-19 DIAGNOSIS — K219 Gastro-esophageal reflux disease without esophagitis: Secondary | ICD-10-CM | POA: Diagnosis not present

## 2016-08-19 DIAGNOSIS — C719 Malignant neoplasm of brain, unspecified: Secondary | ICD-10-CM | POA: Diagnosis not present

## 2016-08-19 DIAGNOSIS — Z510 Encounter for antineoplastic radiation therapy: Secondary | ICD-10-CM | POA: Diagnosis not present

## 2016-08-19 DIAGNOSIS — C712 Malignant neoplasm of temporal lobe: Secondary | ICD-10-CM | POA: Diagnosis not present

## 2016-08-19 DIAGNOSIS — N400 Enlarged prostate without lower urinary tract symptoms: Secondary | ICD-10-CM | POA: Diagnosis not present

## 2016-08-19 DIAGNOSIS — I2510 Atherosclerotic heart disease of native coronary artery without angina pectoris: Secondary | ICD-10-CM | POA: Diagnosis not present

## 2016-08-20 DIAGNOSIS — C712 Malignant neoplasm of temporal lobe: Secondary | ICD-10-CM | POA: Diagnosis not present

## 2016-08-20 DIAGNOSIS — I2510 Atherosclerotic heart disease of native coronary artery without angina pectoris: Secondary | ICD-10-CM | POA: Diagnosis not present

## 2016-08-20 DIAGNOSIS — K219 Gastro-esophageal reflux disease without esophagitis: Secondary | ICD-10-CM | POA: Diagnosis not present

## 2016-08-20 DIAGNOSIS — N400 Enlarged prostate without lower urinary tract symptoms: Secondary | ICD-10-CM | POA: Diagnosis not present

## 2016-08-20 DIAGNOSIS — Z8249 Family history of ischemic heart disease and other diseases of the circulatory system: Secondary | ICD-10-CM | POA: Diagnosis not present

## 2016-08-20 DIAGNOSIS — Z510 Encounter for antineoplastic radiation therapy: Secondary | ICD-10-CM | POA: Diagnosis not present

## 2016-08-20 DIAGNOSIS — C719 Malignant neoplasm of brain, unspecified: Secondary | ICD-10-CM | POA: Diagnosis not present

## 2016-08-21 DIAGNOSIS — K219 Gastro-esophageal reflux disease without esophagitis: Secondary | ICD-10-CM | POA: Diagnosis not present

## 2016-08-21 DIAGNOSIS — Z8249 Family history of ischemic heart disease and other diseases of the circulatory system: Secondary | ICD-10-CM | POA: Diagnosis not present

## 2016-08-21 DIAGNOSIS — I2510 Atherosclerotic heart disease of native coronary artery without angina pectoris: Secondary | ICD-10-CM | POA: Diagnosis not present

## 2016-08-21 DIAGNOSIS — C712 Malignant neoplasm of temporal lobe: Secondary | ICD-10-CM | POA: Diagnosis not present

## 2016-08-21 DIAGNOSIS — C719 Malignant neoplasm of brain, unspecified: Secondary | ICD-10-CM | POA: Diagnosis not present

## 2016-08-21 DIAGNOSIS — Z510 Encounter for antineoplastic radiation therapy: Secondary | ICD-10-CM | POA: Diagnosis not present

## 2016-08-21 DIAGNOSIS — N400 Enlarged prostate without lower urinary tract symptoms: Secondary | ICD-10-CM | POA: Diagnosis not present

## 2016-09-04 DIAGNOSIS — R0602 Shortness of breath: Secondary | ICD-10-CM | POA: Diagnosis not present

## 2016-09-04 DIAGNOSIS — Z8051 Family history of malignant neoplasm of kidney: Secondary | ICD-10-CM | POA: Diagnosis not present

## 2016-09-04 DIAGNOSIS — Z79899 Other long term (current) drug therapy: Secondary | ICD-10-CM | POA: Diagnosis not present

## 2016-09-04 DIAGNOSIS — C719 Malignant neoplasm of brain, unspecified: Secondary | ICD-10-CM | POA: Diagnosis not present

## 2016-09-04 DIAGNOSIS — E7800 Pure hypercholesterolemia, unspecified: Secondary | ICD-10-CM | POA: Diagnosis present

## 2016-09-04 DIAGNOSIS — Z800 Family history of malignant neoplasm of digestive organs: Secondary | ICD-10-CM | POA: Diagnosis not present

## 2016-09-04 DIAGNOSIS — E784 Other hyperlipidemia: Secondary | ICD-10-CM | POA: Diagnosis not present

## 2016-09-04 DIAGNOSIS — F17200 Nicotine dependence, unspecified, uncomplicated: Secondary | ICD-10-CM | POA: Diagnosis present

## 2016-09-04 DIAGNOSIS — I2699 Other pulmonary embolism without acute cor pulmonale: Secondary | ICD-10-CM | POA: Diagnosis not present

## 2016-09-04 DIAGNOSIS — E785 Hyperlipidemia, unspecified: Secondary | ICD-10-CM | POA: Diagnosis not present

## 2016-09-04 DIAGNOSIS — J441 Chronic obstructive pulmonary disease with (acute) exacerbation: Secondary | ICD-10-CM | POA: Diagnosis not present

## 2016-09-04 DIAGNOSIS — N400 Enlarged prostate without lower urinary tract symptoms: Secondary | ICD-10-CM | POA: Diagnosis present

## 2016-09-04 DIAGNOSIS — K219 Gastro-esophageal reflux disease without esophagitis: Secondary | ICD-10-CM | POA: Diagnosis present

## 2016-09-04 DIAGNOSIS — C712 Malignant neoplasm of temporal lobe: Secondary | ICD-10-CM | POA: Diagnosis not present

## 2016-09-04 DIAGNOSIS — R079 Chest pain, unspecified: Secondary | ICD-10-CM | POA: Diagnosis not present

## 2016-09-04 DIAGNOSIS — R05 Cough: Secondary | ICD-10-CM | POA: Diagnosis not present

## 2016-09-04 DIAGNOSIS — J449 Chronic obstructive pulmonary disease, unspecified: Secondary | ICD-10-CM | POA: Diagnosis not present

## 2016-09-04 DIAGNOSIS — R0902 Hypoxemia: Secondary | ICD-10-CM | POA: Diagnosis not present

## 2016-09-04 DIAGNOSIS — R569 Unspecified convulsions: Secondary | ICD-10-CM | POA: Diagnosis present

## 2016-09-14 DIAGNOSIS — C718 Malignant neoplasm of overlapping sites of brain: Secondary | ICD-10-CM | POA: Diagnosis not present

## 2016-09-14 DIAGNOSIS — I2609 Other pulmonary embolism with acute cor pulmonale: Secondary | ICD-10-CM | POA: Diagnosis not present

## 2016-09-14 DIAGNOSIS — J440 Chronic obstructive pulmonary disease with acute lower respiratory infection: Secondary | ICD-10-CM | POA: Diagnosis not present

## 2016-09-17 DIAGNOSIS — C719 Malignant neoplasm of brain, unspecified: Secondary | ICD-10-CM | POA: Diagnosis not present

## 2016-09-17 DIAGNOSIS — Z720 Tobacco use: Secondary | ICD-10-CM | POA: Diagnosis not present

## 2016-09-17 DIAGNOSIS — C712 Malignant neoplasm of temporal lobe: Secondary | ICD-10-CM | POA: Diagnosis not present

## 2016-09-17 DIAGNOSIS — K219 Gastro-esophageal reflux disease without esophagitis: Secondary | ICD-10-CM | POA: Diagnosis not present

## 2016-09-20 DIAGNOSIS — K219 Gastro-esophageal reflux disease without esophagitis: Secondary | ICD-10-CM | POA: Diagnosis not present

## 2016-09-20 DIAGNOSIS — K5900 Constipation, unspecified: Secondary | ICD-10-CM | POA: Diagnosis not present

## 2016-09-20 DIAGNOSIS — I10 Essential (primary) hypertension: Secondary | ICD-10-CM | POA: Diagnosis not present

## 2016-09-20 DIAGNOSIS — I495 Sick sinus syndrome: Secondary | ICD-10-CM | POA: Diagnosis not present

## 2016-09-20 DIAGNOSIS — N401 Enlarged prostate with lower urinary tract symptoms: Secondary | ICD-10-CM | POA: Diagnosis not present

## 2016-09-20 DIAGNOSIS — C712 Malignant neoplasm of temporal lobe: Secondary | ICD-10-CM | POA: Diagnosis not present

## 2016-09-22 DIAGNOSIS — C712 Malignant neoplasm of temporal lobe: Secondary | ICD-10-CM | POA: Diagnosis not present

## 2016-09-22 DIAGNOSIS — I495 Sick sinus syndrome: Secondary | ICD-10-CM | POA: Diagnosis not present

## 2016-09-22 DIAGNOSIS — K219 Gastro-esophageal reflux disease without esophagitis: Secondary | ICD-10-CM | POA: Diagnosis not present

## 2016-09-22 DIAGNOSIS — N401 Enlarged prostate with lower urinary tract symptoms: Secondary | ICD-10-CM | POA: Diagnosis not present

## 2016-09-22 DIAGNOSIS — I10 Essential (primary) hypertension: Secondary | ICD-10-CM | POA: Diagnosis not present

## 2016-09-22 DIAGNOSIS — K5900 Constipation, unspecified: Secondary | ICD-10-CM | POA: Diagnosis not present

## 2016-09-23 DIAGNOSIS — K219 Gastro-esophageal reflux disease without esophagitis: Secondary | ICD-10-CM | POA: Diagnosis not present

## 2016-09-23 DIAGNOSIS — I495 Sick sinus syndrome: Secondary | ICD-10-CM | POA: Diagnosis not present

## 2016-09-23 DIAGNOSIS — N401 Enlarged prostate with lower urinary tract symptoms: Secondary | ICD-10-CM | POA: Diagnosis not present

## 2016-09-23 DIAGNOSIS — C712 Malignant neoplasm of temporal lobe: Secondary | ICD-10-CM | POA: Diagnosis not present

## 2016-09-23 DIAGNOSIS — I10 Essential (primary) hypertension: Secondary | ICD-10-CM | POA: Diagnosis not present

## 2016-09-23 DIAGNOSIS — K5900 Constipation, unspecified: Secondary | ICD-10-CM | POA: Diagnosis not present

## 2016-09-24 DIAGNOSIS — I495 Sick sinus syndrome: Secondary | ICD-10-CM | POA: Diagnosis not present

## 2016-09-24 DIAGNOSIS — N401 Enlarged prostate with lower urinary tract symptoms: Secondary | ICD-10-CM | POA: Diagnosis not present

## 2016-09-24 DIAGNOSIS — C712 Malignant neoplasm of temporal lobe: Secondary | ICD-10-CM | POA: Diagnosis not present

## 2016-09-24 DIAGNOSIS — K5900 Constipation, unspecified: Secondary | ICD-10-CM | POA: Diagnosis not present

## 2016-09-24 DIAGNOSIS — K219 Gastro-esophageal reflux disease without esophagitis: Secondary | ICD-10-CM | POA: Diagnosis not present

## 2016-09-24 DIAGNOSIS — I10 Essential (primary) hypertension: Secondary | ICD-10-CM | POA: Diagnosis not present

## 2016-09-25 DIAGNOSIS — I495 Sick sinus syndrome: Secondary | ICD-10-CM | POA: Diagnosis not present

## 2016-09-25 DIAGNOSIS — I10 Essential (primary) hypertension: Secondary | ICD-10-CM | POA: Diagnosis not present

## 2016-09-25 DIAGNOSIS — K5900 Constipation, unspecified: Secondary | ICD-10-CM | POA: Diagnosis not present

## 2016-09-25 DIAGNOSIS — N401 Enlarged prostate with lower urinary tract symptoms: Secondary | ICD-10-CM | POA: Diagnosis not present

## 2016-09-25 DIAGNOSIS — C712 Malignant neoplasm of temporal lobe: Secondary | ICD-10-CM | POA: Diagnosis not present

## 2016-09-25 DIAGNOSIS — K219 Gastro-esophageal reflux disease without esophagitis: Secondary | ICD-10-CM | POA: Diagnosis not present

## 2016-09-28 DIAGNOSIS — I495 Sick sinus syndrome: Secondary | ICD-10-CM | POA: Diagnosis not present

## 2016-09-28 DIAGNOSIS — C712 Malignant neoplasm of temporal lobe: Secondary | ICD-10-CM | POA: Diagnosis not present

## 2016-09-28 DIAGNOSIS — I10 Essential (primary) hypertension: Secondary | ICD-10-CM | POA: Diagnosis not present

## 2016-09-28 DIAGNOSIS — K5900 Constipation, unspecified: Secondary | ICD-10-CM | POA: Diagnosis not present

## 2016-09-28 DIAGNOSIS — K219 Gastro-esophageal reflux disease without esophagitis: Secondary | ICD-10-CM | POA: Diagnosis not present

## 2016-09-28 DIAGNOSIS — N401 Enlarged prostate with lower urinary tract symptoms: Secondary | ICD-10-CM | POA: Diagnosis not present

## 2016-09-29 DIAGNOSIS — I495 Sick sinus syndrome: Secondary | ICD-10-CM | POA: Diagnosis not present

## 2016-09-29 DIAGNOSIS — C712 Malignant neoplasm of temporal lobe: Secondary | ICD-10-CM | POA: Diagnosis not present

## 2016-09-29 DIAGNOSIS — K219 Gastro-esophageal reflux disease without esophagitis: Secondary | ICD-10-CM | POA: Diagnosis not present

## 2016-09-29 DIAGNOSIS — N401 Enlarged prostate with lower urinary tract symptoms: Secondary | ICD-10-CM | POA: Diagnosis not present

## 2016-09-29 DIAGNOSIS — I10 Essential (primary) hypertension: Secondary | ICD-10-CM | POA: Diagnosis not present

## 2016-09-29 DIAGNOSIS — K5900 Constipation, unspecified: Secondary | ICD-10-CM | POA: Diagnosis not present

## 2016-09-30 DIAGNOSIS — C712 Malignant neoplasm of temporal lobe: Secondary | ICD-10-CM | POA: Diagnosis not present

## 2016-09-30 DIAGNOSIS — N401 Enlarged prostate with lower urinary tract symptoms: Secondary | ICD-10-CM | POA: Diagnosis not present

## 2016-09-30 DIAGNOSIS — K219 Gastro-esophageal reflux disease without esophagitis: Secondary | ICD-10-CM | POA: Diagnosis not present

## 2016-09-30 DIAGNOSIS — I495 Sick sinus syndrome: Secondary | ICD-10-CM | POA: Diagnosis not present

## 2016-09-30 DIAGNOSIS — K5900 Constipation, unspecified: Secondary | ICD-10-CM | POA: Diagnosis not present

## 2016-09-30 DIAGNOSIS — I10 Essential (primary) hypertension: Secondary | ICD-10-CM | POA: Diagnosis not present

## 2016-10-01 DIAGNOSIS — K219 Gastro-esophageal reflux disease without esophagitis: Secondary | ICD-10-CM | POA: Diagnosis not present

## 2016-10-01 DIAGNOSIS — K5900 Constipation, unspecified: Secondary | ICD-10-CM | POA: Diagnosis not present

## 2016-10-01 DIAGNOSIS — I495 Sick sinus syndrome: Secondary | ICD-10-CM | POA: Diagnosis not present

## 2016-10-01 DIAGNOSIS — N401 Enlarged prostate with lower urinary tract symptoms: Secondary | ICD-10-CM | POA: Diagnosis not present

## 2016-10-01 DIAGNOSIS — I10 Essential (primary) hypertension: Secondary | ICD-10-CM | POA: Diagnosis not present

## 2016-10-01 DIAGNOSIS — C712 Malignant neoplasm of temporal lobe: Secondary | ICD-10-CM | POA: Diagnosis not present

## 2016-10-05 DIAGNOSIS — N39498 Other specified urinary incontinence: Secondary | ICD-10-CM | POA: Diagnosis not present

## 2016-10-06 DIAGNOSIS — I495 Sick sinus syndrome: Secondary | ICD-10-CM | POA: Diagnosis not present

## 2016-10-06 DIAGNOSIS — C712 Malignant neoplasm of temporal lobe: Secondary | ICD-10-CM | POA: Diagnosis not present

## 2016-10-06 DIAGNOSIS — K219 Gastro-esophageal reflux disease without esophagitis: Secondary | ICD-10-CM | POA: Diagnosis not present

## 2016-10-06 DIAGNOSIS — I10 Essential (primary) hypertension: Secondary | ICD-10-CM | POA: Diagnosis not present

## 2016-10-06 DIAGNOSIS — K5900 Constipation, unspecified: Secondary | ICD-10-CM | POA: Diagnosis not present

## 2016-10-06 DIAGNOSIS — N401 Enlarged prostate with lower urinary tract symptoms: Secondary | ICD-10-CM | POA: Diagnosis not present

## 2016-10-07 DIAGNOSIS — I10 Essential (primary) hypertension: Secondary | ICD-10-CM | POA: Diagnosis not present

## 2016-10-07 DIAGNOSIS — I495 Sick sinus syndrome: Secondary | ICD-10-CM | POA: Diagnosis not present

## 2016-10-07 DIAGNOSIS — K219 Gastro-esophageal reflux disease without esophagitis: Secondary | ICD-10-CM | POA: Diagnosis not present

## 2016-10-07 DIAGNOSIS — K5900 Constipation, unspecified: Secondary | ICD-10-CM | POA: Diagnosis not present

## 2016-10-07 DIAGNOSIS — C712 Malignant neoplasm of temporal lobe: Secondary | ICD-10-CM | POA: Diagnosis not present

## 2016-10-07 DIAGNOSIS — N401 Enlarged prostate with lower urinary tract symptoms: Secondary | ICD-10-CM | POA: Diagnosis not present

## 2016-10-09 DIAGNOSIS — K5900 Constipation, unspecified: Secondary | ICD-10-CM | POA: Diagnosis not present

## 2016-10-09 DIAGNOSIS — K219 Gastro-esophageal reflux disease without esophagitis: Secondary | ICD-10-CM | POA: Diagnosis not present

## 2016-10-09 DIAGNOSIS — N401 Enlarged prostate with lower urinary tract symptoms: Secondary | ICD-10-CM | POA: Diagnosis not present

## 2016-10-09 DIAGNOSIS — I10 Essential (primary) hypertension: Secondary | ICD-10-CM | POA: Diagnosis not present

## 2016-10-09 DIAGNOSIS — C712 Malignant neoplasm of temporal lobe: Secondary | ICD-10-CM | POA: Diagnosis not present

## 2016-10-09 DIAGNOSIS — I495 Sick sinus syndrome: Secondary | ICD-10-CM | POA: Diagnosis not present

## 2016-10-12 DIAGNOSIS — C712 Malignant neoplasm of temporal lobe: Secondary | ICD-10-CM | POA: Diagnosis not present

## 2016-10-12 DIAGNOSIS — K219 Gastro-esophageal reflux disease without esophagitis: Secondary | ICD-10-CM | POA: Diagnosis not present

## 2016-10-12 DIAGNOSIS — N401 Enlarged prostate with lower urinary tract symptoms: Secondary | ICD-10-CM | POA: Diagnosis not present

## 2016-10-12 DIAGNOSIS — K5900 Constipation, unspecified: Secondary | ICD-10-CM | POA: Diagnosis not present

## 2016-10-12 DIAGNOSIS — I495 Sick sinus syndrome: Secondary | ICD-10-CM | POA: Diagnosis not present

## 2016-10-12 DIAGNOSIS — I10 Essential (primary) hypertension: Secondary | ICD-10-CM | POA: Diagnosis not present

## 2016-10-13 DIAGNOSIS — K5900 Constipation, unspecified: Secondary | ICD-10-CM | POA: Diagnosis not present

## 2016-10-13 DIAGNOSIS — K219 Gastro-esophageal reflux disease without esophagitis: Secondary | ICD-10-CM | POA: Diagnosis not present

## 2016-10-13 DIAGNOSIS — I495 Sick sinus syndrome: Secondary | ICD-10-CM | POA: Diagnosis not present

## 2016-10-13 DIAGNOSIS — N401 Enlarged prostate with lower urinary tract symptoms: Secondary | ICD-10-CM | POA: Diagnosis not present

## 2016-10-13 DIAGNOSIS — I10 Essential (primary) hypertension: Secondary | ICD-10-CM | POA: Diagnosis not present

## 2016-10-13 DIAGNOSIS — C712 Malignant neoplasm of temporal lobe: Secondary | ICD-10-CM | POA: Diagnosis not present

## 2016-10-14 DIAGNOSIS — K5900 Constipation, unspecified: Secondary | ICD-10-CM | POA: Diagnosis not present

## 2016-10-14 DIAGNOSIS — K219 Gastro-esophageal reflux disease without esophagitis: Secondary | ICD-10-CM | POA: Diagnosis not present

## 2016-10-14 DIAGNOSIS — I495 Sick sinus syndrome: Secondary | ICD-10-CM | POA: Diagnosis not present

## 2016-10-14 DIAGNOSIS — I10 Essential (primary) hypertension: Secondary | ICD-10-CM | POA: Diagnosis not present

## 2016-10-14 DIAGNOSIS — C712 Malignant neoplasm of temporal lobe: Secondary | ICD-10-CM | POA: Diagnosis not present

## 2016-10-14 DIAGNOSIS — N401 Enlarged prostate with lower urinary tract symptoms: Secondary | ICD-10-CM | POA: Diagnosis not present

## 2016-10-18 DIAGNOSIS — I10 Essential (primary) hypertension: Secondary | ICD-10-CM | POA: Diagnosis not present

## 2016-10-18 DIAGNOSIS — K219 Gastro-esophageal reflux disease without esophagitis: Secondary | ICD-10-CM | POA: Diagnosis not present

## 2016-10-18 DIAGNOSIS — N401 Enlarged prostate with lower urinary tract symptoms: Secondary | ICD-10-CM | POA: Diagnosis not present

## 2016-10-18 DIAGNOSIS — I495 Sick sinus syndrome: Secondary | ICD-10-CM | POA: Diagnosis not present

## 2016-10-18 DIAGNOSIS — C712 Malignant neoplasm of temporal lobe: Secondary | ICD-10-CM | POA: Diagnosis not present

## 2016-10-18 DIAGNOSIS — K5900 Constipation, unspecified: Secondary | ICD-10-CM | POA: Diagnosis not present

## 2016-10-19 DIAGNOSIS — C712 Malignant neoplasm of temporal lobe: Secondary | ICD-10-CM | POA: Diagnosis not present

## 2016-10-19 DIAGNOSIS — I10 Essential (primary) hypertension: Secondary | ICD-10-CM | POA: Diagnosis not present

## 2016-10-19 DIAGNOSIS — I495 Sick sinus syndrome: Secondary | ICD-10-CM | POA: Diagnosis not present

## 2016-10-19 DIAGNOSIS — K219 Gastro-esophageal reflux disease without esophagitis: Secondary | ICD-10-CM | POA: Diagnosis not present

## 2016-10-19 DIAGNOSIS — N401 Enlarged prostate with lower urinary tract symptoms: Secondary | ICD-10-CM | POA: Diagnosis not present

## 2016-10-19 DIAGNOSIS — K5900 Constipation, unspecified: Secondary | ICD-10-CM | POA: Diagnosis not present

## 2016-10-20 DIAGNOSIS — K5900 Constipation, unspecified: Secondary | ICD-10-CM | POA: Diagnosis not present

## 2016-10-20 DIAGNOSIS — I10 Essential (primary) hypertension: Secondary | ICD-10-CM | POA: Diagnosis not present

## 2016-10-20 DIAGNOSIS — I495 Sick sinus syndrome: Secondary | ICD-10-CM | POA: Diagnosis not present

## 2016-10-20 DIAGNOSIS — N401 Enlarged prostate with lower urinary tract symptoms: Secondary | ICD-10-CM | POA: Diagnosis not present

## 2016-10-20 DIAGNOSIS — K219 Gastro-esophageal reflux disease without esophagitis: Secondary | ICD-10-CM | POA: Diagnosis not present

## 2016-10-20 DIAGNOSIS — C712 Malignant neoplasm of temporal lobe: Secondary | ICD-10-CM | POA: Diagnosis not present

## 2016-10-21 DIAGNOSIS — C712 Malignant neoplasm of temporal lobe: Secondary | ICD-10-CM | POA: Diagnosis not present

## 2016-10-21 DIAGNOSIS — I10 Essential (primary) hypertension: Secondary | ICD-10-CM | POA: Diagnosis not present

## 2016-10-21 DIAGNOSIS — K5900 Constipation, unspecified: Secondary | ICD-10-CM | POA: Diagnosis not present

## 2016-10-21 DIAGNOSIS — K219 Gastro-esophageal reflux disease without esophagitis: Secondary | ICD-10-CM | POA: Diagnosis not present

## 2016-10-21 DIAGNOSIS — N401 Enlarged prostate with lower urinary tract symptoms: Secondary | ICD-10-CM | POA: Diagnosis not present

## 2016-10-21 DIAGNOSIS — I495 Sick sinus syndrome: Secondary | ICD-10-CM | POA: Diagnosis not present

## 2016-10-22 DIAGNOSIS — I10 Essential (primary) hypertension: Secondary | ICD-10-CM | POA: Diagnosis not present

## 2016-10-22 DIAGNOSIS — I495 Sick sinus syndrome: Secondary | ICD-10-CM | POA: Diagnosis not present

## 2016-10-22 DIAGNOSIS — N401 Enlarged prostate with lower urinary tract symptoms: Secondary | ICD-10-CM | POA: Diagnosis not present

## 2016-10-22 DIAGNOSIS — C712 Malignant neoplasm of temporal lobe: Secondary | ICD-10-CM | POA: Diagnosis not present

## 2016-10-22 DIAGNOSIS — K219 Gastro-esophageal reflux disease without esophagitis: Secondary | ICD-10-CM | POA: Diagnosis not present

## 2016-10-22 DIAGNOSIS — K5900 Constipation, unspecified: Secondary | ICD-10-CM | POA: Diagnosis not present

## 2016-10-23 DIAGNOSIS — R350 Frequency of micturition: Secondary | ICD-10-CM | POA: Diagnosis not present

## 2016-10-23 DIAGNOSIS — R5381 Other malaise: Secondary | ICD-10-CM | POA: Diagnosis not present

## 2016-10-23 DIAGNOSIS — Z923 Personal history of irradiation: Secondary | ICD-10-CM | POA: Diagnosis not present

## 2016-10-23 DIAGNOSIS — C719 Malignant neoplasm of brain, unspecified: Secondary | ICD-10-CM | POA: Diagnosis not present

## 2016-10-26 DIAGNOSIS — I10 Essential (primary) hypertension: Secondary | ICD-10-CM | POA: Diagnosis not present

## 2016-10-26 DIAGNOSIS — K219 Gastro-esophageal reflux disease without esophagitis: Secondary | ICD-10-CM | POA: Diagnosis not present

## 2016-10-26 DIAGNOSIS — K5900 Constipation, unspecified: Secondary | ICD-10-CM | POA: Diagnosis not present

## 2016-10-26 DIAGNOSIS — C712 Malignant neoplasm of temporal lobe: Secondary | ICD-10-CM | POA: Diagnosis not present

## 2016-10-26 DIAGNOSIS — N401 Enlarged prostate with lower urinary tract symptoms: Secondary | ICD-10-CM | POA: Diagnosis not present

## 2016-10-26 DIAGNOSIS — I495 Sick sinus syndrome: Secondary | ICD-10-CM | POA: Diagnosis not present

## 2016-10-27 DIAGNOSIS — C712 Malignant neoplasm of temporal lobe: Secondary | ICD-10-CM | POA: Diagnosis not present

## 2016-10-27 DIAGNOSIS — K5900 Constipation, unspecified: Secondary | ICD-10-CM | POA: Diagnosis not present

## 2016-10-27 DIAGNOSIS — N401 Enlarged prostate with lower urinary tract symptoms: Secondary | ICD-10-CM | POA: Diagnosis not present

## 2016-10-27 DIAGNOSIS — I10 Essential (primary) hypertension: Secondary | ICD-10-CM | POA: Diagnosis not present

## 2016-10-27 DIAGNOSIS — N39498 Other specified urinary incontinence: Secondary | ICD-10-CM | POA: Diagnosis not present

## 2016-10-27 DIAGNOSIS — K219 Gastro-esophageal reflux disease without esophagitis: Secondary | ICD-10-CM | POA: Diagnosis not present

## 2016-10-27 DIAGNOSIS — I495 Sick sinus syndrome: Secondary | ICD-10-CM | POA: Diagnosis not present

## 2016-11-03 DIAGNOSIS — C712 Malignant neoplasm of temporal lobe: Secondary | ICD-10-CM | POA: Diagnosis not present

## 2016-11-03 DIAGNOSIS — I10 Essential (primary) hypertension: Secondary | ICD-10-CM | POA: Diagnosis not present

## 2016-11-03 DIAGNOSIS — I495 Sick sinus syndrome: Secondary | ICD-10-CM | POA: Diagnosis not present

## 2016-11-03 DIAGNOSIS — K219 Gastro-esophageal reflux disease without esophagitis: Secondary | ICD-10-CM | POA: Diagnosis not present

## 2016-11-03 DIAGNOSIS — N401 Enlarged prostate with lower urinary tract symptoms: Secondary | ICD-10-CM | POA: Diagnosis not present

## 2016-11-03 DIAGNOSIS — K5900 Constipation, unspecified: Secondary | ICD-10-CM | POA: Diagnosis not present

## 2016-11-04 DIAGNOSIS — N401 Enlarged prostate with lower urinary tract symptoms: Secondary | ICD-10-CM | POA: Diagnosis not present

## 2016-11-04 DIAGNOSIS — K5900 Constipation, unspecified: Secondary | ICD-10-CM | POA: Diagnosis not present

## 2016-11-04 DIAGNOSIS — C712 Malignant neoplasm of temporal lobe: Secondary | ICD-10-CM | POA: Diagnosis not present

## 2016-11-04 DIAGNOSIS — I10 Essential (primary) hypertension: Secondary | ICD-10-CM | POA: Diagnosis not present

## 2016-11-04 DIAGNOSIS — I495 Sick sinus syndrome: Secondary | ICD-10-CM | POA: Diagnosis not present

## 2016-11-04 DIAGNOSIS — K219 Gastro-esophageal reflux disease without esophagitis: Secondary | ICD-10-CM | POA: Diagnosis not present

## 2016-11-10 DIAGNOSIS — I10 Essential (primary) hypertension: Secondary | ICD-10-CM | POA: Diagnosis not present

## 2016-11-10 DIAGNOSIS — K219 Gastro-esophageal reflux disease without esophagitis: Secondary | ICD-10-CM | POA: Diagnosis not present

## 2016-11-10 DIAGNOSIS — N401 Enlarged prostate with lower urinary tract symptoms: Secondary | ICD-10-CM | POA: Diagnosis not present

## 2016-11-10 DIAGNOSIS — K5900 Constipation, unspecified: Secondary | ICD-10-CM | POA: Diagnosis not present

## 2016-11-10 DIAGNOSIS — I495 Sick sinus syndrome: Secondary | ICD-10-CM | POA: Diagnosis not present

## 2016-11-10 DIAGNOSIS — C712 Malignant neoplasm of temporal lobe: Secondary | ICD-10-CM | POA: Diagnosis not present

## 2016-11-13 ENCOUNTER — Encounter: Payer: Self-pay | Admitting: Internal Medicine

## 2016-11-13 ENCOUNTER — Ambulatory Visit (INDEPENDENT_AMBULATORY_CARE_PROVIDER_SITE_OTHER): Payer: Medicare Other | Admitting: *Deleted

## 2016-11-13 DIAGNOSIS — I495 Sick sinus syndrome: Secondary | ICD-10-CM | POA: Diagnosis not present

## 2016-11-17 DIAGNOSIS — C712 Malignant neoplasm of temporal lobe: Secondary | ICD-10-CM | POA: Diagnosis not present

## 2016-11-17 DIAGNOSIS — I10 Essential (primary) hypertension: Secondary | ICD-10-CM | POA: Diagnosis not present

## 2016-11-17 DIAGNOSIS — I495 Sick sinus syndrome: Secondary | ICD-10-CM | POA: Diagnosis not present

## 2016-11-17 DIAGNOSIS — K219 Gastro-esophageal reflux disease without esophagitis: Secondary | ICD-10-CM | POA: Diagnosis not present

## 2016-11-17 DIAGNOSIS — K5900 Constipation, unspecified: Secondary | ICD-10-CM | POA: Diagnosis not present

## 2016-11-17 DIAGNOSIS — N401 Enlarged prostate with lower urinary tract symptoms: Secondary | ICD-10-CM | POA: Diagnosis not present

## 2016-11-17 NOTE — Progress Notes (Signed)
Remote pacemaker transmission.   

## 2016-11-19 ENCOUNTER — Encounter: Payer: Self-pay | Admitting: Cardiology

## 2016-11-19 DIAGNOSIS — Z7901 Long term (current) use of anticoagulants: Secondary | ICD-10-CM | POA: Diagnosis not present

## 2016-11-19 DIAGNOSIS — Z950 Presence of cardiac pacemaker: Secondary | ICD-10-CM | POA: Diagnosis not present

## 2016-11-19 DIAGNOSIS — F17200 Nicotine dependence, unspecified, uncomplicated: Secondary | ICD-10-CM | POA: Diagnosis not present

## 2016-11-19 DIAGNOSIS — I2699 Other pulmonary embolism without acute cor pulmonale: Secondary | ICD-10-CM | POA: Diagnosis not present

## 2016-11-19 DIAGNOSIS — C719 Malignant neoplasm of brain, unspecified: Secondary | ICD-10-CM | POA: Diagnosis not present

## 2016-11-19 DIAGNOSIS — K219 Gastro-esophageal reflux disease without esophagitis: Secondary | ICD-10-CM | POA: Diagnosis not present

## 2016-11-19 DIAGNOSIS — N400 Enlarged prostate without lower urinary tract symptoms: Secondary | ICD-10-CM | POA: Diagnosis not present

## 2016-11-19 DIAGNOSIS — Z8679 Personal history of other diseases of the circulatory system: Secondary | ICD-10-CM | POA: Diagnosis not present

## 2016-11-19 DIAGNOSIS — Z720 Tobacco use: Secondary | ICD-10-CM | POA: Diagnosis not present

## 2016-11-24 DIAGNOSIS — I495 Sick sinus syndrome: Secondary | ICD-10-CM | POA: Diagnosis not present

## 2016-11-24 DIAGNOSIS — F419 Anxiety disorder, unspecified: Secondary | ICD-10-CM | POA: Diagnosis not present

## 2016-11-24 DIAGNOSIS — I10 Essential (primary) hypertension: Secondary | ICD-10-CM | POA: Diagnosis not present

## 2016-11-24 DIAGNOSIS — J449 Chronic obstructive pulmonary disease, unspecified: Secondary | ICD-10-CM | POA: Diagnosis not present

## 2016-11-24 DIAGNOSIS — C712 Malignant neoplasm of temporal lobe: Secondary | ICD-10-CM | POA: Diagnosis not present

## 2016-11-24 DIAGNOSIS — I2699 Other pulmonary embolism without acute cor pulmonale: Secondary | ICD-10-CM | POA: Diagnosis not present

## 2016-11-24 LAB — CUP PACEART REMOTE DEVICE CHECK
Battery Remaining Percentage: 95.5 %
Brady Statistic AP VP Percent: 1 %
Brady Statistic AP VS Percent: 44 %
Brady Statistic AS VS Percent: 55 %
Brady Statistic RV Percent Paced: 1 %
Implantable Lead Implant Date: 20171122
Implantable Lead Location: 753859
Lead Channel Pacing Threshold Amplitude: 0.75 V
Lead Channel Pacing Threshold Pulse Width: 0.5 ms
Lead Channel Sensing Intrinsic Amplitude: 4.3 mV
Lead Channel Setting Pacing Amplitude: 2.5 V
Lead Channel Setting Pacing Pulse Width: 0.5 ms
Lead Channel Setting Sensing Sensitivity: 2 mV
MDC IDC LEAD IMPLANT DT: 20171122
MDC IDC LEAD LOCATION: 753860
MDC IDC MSMT BATTERY REMAINING LONGEVITY: 115 mo
MDC IDC MSMT BATTERY VOLTAGE: 3.01 V
MDC IDC MSMT LEADCHNL RA IMPEDANCE VALUE: 460 Ohm
MDC IDC MSMT LEADCHNL RA PACING THRESHOLD AMPLITUDE: 0.5 V
MDC IDC MSMT LEADCHNL RA PACING THRESHOLD PULSEWIDTH: 0.5 ms
MDC IDC MSMT LEADCHNL RV IMPEDANCE VALUE: 540 Ohm
MDC IDC MSMT LEADCHNL RV SENSING INTR AMPL: 12 mV
MDC IDC PG IMPLANT DT: 20171122
MDC IDC PG SERIAL: 3182714
MDC IDC SESS DTM: 20180721001455
MDC IDC SET LEADCHNL RA PACING AMPLITUDE: 2 V
MDC IDC STAT BRADY AS VP PERCENT: 1 %
MDC IDC STAT BRADY RA PERCENT PACED: 44 %

## 2016-11-25 DIAGNOSIS — I2699 Other pulmonary embolism without acute cor pulmonale: Secondary | ICD-10-CM | POA: Diagnosis not present

## 2016-11-25 DIAGNOSIS — I495 Sick sinus syndrome: Secondary | ICD-10-CM | POA: Diagnosis not present

## 2016-11-25 DIAGNOSIS — F419 Anxiety disorder, unspecified: Secondary | ICD-10-CM | POA: Diagnosis not present

## 2016-11-25 DIAGNOSIS — C712 Malignant neoplasm of temporal lobe: Secondary | ICD-10-CM | POA: Diagnosis not present

## 2016-11-25 DIAGNOSIS — J449 Chronic obstructive pulmonary disease, unspecified: Secondary | ICD-10-CM | POA: Diagnosis not present

## 2016-11-25 DIAGNOSIS — I10 Essential (primary) hypertension: Secondary | ICD-10-CM | POA: Diagnosis not present

## 2016-11-27 ENCOUNTER — Telehealth: Payer: Self-pay

## 2016-11-27 DIAGNOSIS — J449 Chronic obstructive pulmonary disease, unspecified: Secondary | ICD-10-CM | POA: Diagnosis not present

## 2016-11-27 DIAGNOSIS — I10 Essential (primary) hypertension: Secondary | ICD-10-CM | POA: Diagnosis not present

## 2016-11-27 DIAGNOSIS — F419 Anxiety disorder, unspecified: Secondary | ICD-10-CM | POA: Diagnosis not present

## 2016-11-27 DIAGNOSIS — C712 Malignant neoplasm of temporal lobe: Secondary | ICD-10-CM | POA: Diagnosis not present

## 2016-11-27 DIAGNOSIS — I2699 Other pulmonary embolism without acute cor pulmonale: Secondary | ICD-10-CM | POA: Diagnosis not present

## 2016-11-27 DIAGNOSIS — I495 Sick sinus syndrome: Secondary | ICD-10-CM | POA: Diagnosis not present

## 2016-11-27 NOTE — Telephone Encounter (Signed)
-----   Message from Evans Lance, MD sent at 11/27/2016  3:54 PM EDT ----- Remote device check reviewed. Histograms appropriate. Leads and battery stable for patient. Follow up as outlined above. No recommended changes. Would suggest starting Eliquis 5 bid.

## 2016-11-27 NOTE — Telephone Encounter (Signed)
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 Confirmed with pt's wife that pt was still taking medication.

## 2016-11-29 DIAGNOSIS — F419 Anxiety disorder, unspecified: Secondary | ICD-10-CM | POA: Diagnosis not present

## 2016-11-29 DIAGNOSIS — C712 Malignant neoplasm of temporal lobe: Secondary | ICD-10-CM | POA: Diagnosis not present

## 2016-11-29 DIAGNOSIS — I495 Sick sinus syndrome: Secondary | ICD-10-CM | POA: Diagnosis not present

## 2016-11-29 DIAGNOSIS — I2699 Other pulmonary embolism without acute cor pulmonale: Secondary | ICD-10-CM | POA: Diagnosis not present

## 2016-11-29 DIAGNOSIS — J449 Chronic obstructive pulmonary disease, unspecified: Secondary | ICD-10-CM | POA: Diagnosis not present

## 2016-11-29 DIAGNOSIS — I10 Essential (primary) hypertension: Secondary | ICD-10-CM | POA: Diagnosis not present

## 2016-12-01 DIAGNOSIS — I2699 Other pulmonary embolism without acute cor pulmonale: Secondary | ICD-10-CM | POA: Diagnosis not present

## 2016-12-01 DIAGNOSIS — F419 Anxiety disorder, unspecified: Secondary | ICD-10-CM | POA: Diagnosis not present

## 2016-12-01 DIAGNOSIS — J449 Chronic obstructive pulmonary disease, unspecified: Secondary | ICD-10-CM | POA: Diagnosis not present

## 2016-12-01 DIAGNOSIS — I495 Sick sinus syndrome: Secondary | ICD-10-CM | POA: Diagnosis not present

## 2016-12-01 DIAGNOSIS — C712 Malignant neoplasm of temporal lobe: Secondary | ICD-10-CM | POA: Diagnosis not present

## 2016-12-01 DIAGNOSIS — I10 Essential (primary) hypertension: Secondary | ICD-10-CM | POA: Diagnosis not present

## 2016-12-02 DIAGNOSIS — I10 Essential (primary) hypertension: Secondary | ICD-10-CM | POA: Diagnosis not present

## 2016-12-02 DIAGNOSIS — I2699 Other pulmonary embolism without acute cor pulmonale: Secondary | ICD-10-CM | POA: Diagnosis not present

## 2016-12-02 DIAGNOSIS — I495 Sick sinus syndrome: Secondary | ICD-10-CM | POA: Diagnosis not present

## 2016-12-02 DIAGNOSIS — F419 Anxiety disorder, unspecified: Secondary | ICD-10-CM | POA: Diagnosis not present

## 2016-12-02 DIAGNOSIS — J449 Chronic obstructive pulmonary disease, unspecified: Secondary | ICD-10-CM | POA: Diagnosis not present

## 2016-12-02 DIAGNOSIS — C712 Malignant neoplasm of temporal lobe: Secondary | ICD-10-CM | POA: Diagnosis not present

## 2016-12-03 DIAGNOSIS — I495 Sick sinus syndrome: Secondary | ICD-10-CM | POA: Diagnosis not present

## 2016-12-03 DIAGNOSIS — C712 Malignant neoplasm of temporal lobe: Secondary | ICD-10-CM | POA: Diagnosis not present

## 2016-12-03 DIAGNOSIS — I2699 Other pulmonary embolism without acute cor pulmonale: Secondary | ICD-10-CM | POA: Diagnosis not present

## 2016-12-03 DIAGNOSIS — I10 Essential (primary) hypertension: Secondary | ICD-10-CM | POA: Diagnosis not present

## 2016-12-03 DIAGNOSIS — J449 Chronic obstructive pulmonary disease, unspecified: Secondary | ICD-10-CM | POA: Diagnosis not present

## 2016-12-03 DIAGNOSIS — F419 Anxiety disorder, unspecified: Secondary | ICD-10-CM | POA: Diagnosis not present

## 2016-12-04 ENCOUNTER — Telehealth: Payer: Self-pay | Admitting: *Deleted

## 2016-12-04 NOTE — Telephone Encounter (Signed)
Able to reach [employer] rep, who will get in contact with the patient.

## 2016-12-04 NOTE — Telephone Encounter (Signed)
Patient's wife called to state that patient's Merlin home monitor is blinking and beeping and is not responding to the button so they had to unplug it.  They have been unsuccessful in reaching [employer]/Merlin tech services.  Advised I will contact tech services and call back.  Patient's wife is Patent attorney.  Patient is overdue for 91 day f/u with Dr. Lovena Le.  Patient's wife is agreeable to next available appointment on 01/04/17.  She denies additional questions or concerns at this time.

## 2016-12-04 NOTE — Telephone Encounter (Signed)
Spoke with patient's wife.  She reports that the [employer] rep ordered the patient a new monitor as his current monitor is broken.  She is aware to call if they need assistance setting it up and denies additional questions or concerns at this time.

## 2016-12-08 DIAGNOSIS — C712 Malignant neoplasm of temporal lobe: Secondary | ICD-10-CM | POA: Diagnosis not present

## 2016-12-08 DIAGNOSIS — J449 Chronic obstructive pulmonary disease, unspecified: Secondary | ICD-10-CM | POA: Diagnosis not present

## 2016-12-08 DIAGNOSIS — I10 Essential (primary) hypertension: Secondary | ICD-10-CM | POA: Diagnosis not present

## 2016-12-08 DIAGNOSIS — I2699 Other pulmonary embolism without acute cor pulmonale: Secondary | ICD-10-CM | POA: Diagnosis not present

## 2016-12-08 DIAGNOSIS — I495 Sick sinus syndrome: Secondary | ICD-10-CM | POA: Diagnosis not present

## 2016-12-08 DIAGNOSIS — F419 Anxiety disorder, unspecified: Secondary | ICD-10-CM | POA: Diagnosis not present

## 2016-12-11 ENCOUNTER — Encounter: Payer: Self-pay | Admitting: *Deleted

## 2016-12-11 DIAGNOSIS — I10 Essential (primary) hypertension: Secondary | ICD-10-CM | POA: Diagnosis not present

## 2016-12-11 DIAGNOSIS — C712 Malignant neoplasm of temporal lobe: Secondary | ICD-10-CM | POA: Diagnosis not present

## 2016-12-11 DIAGNOSIS — J449 Chronic obstructive pulmonary disease, unspecified: Secondary | ICD-10-CM | POA: Diagnosis not present

## 2016-12-11 DIAGNOSIS — F419 Anxiety disorder, unspecified: Secondary | ICD-10-CM | POA: Diagnosis not present

## 2016-12-11 DIAGNOSIS — I495 Sick sinus syndrome: Secondary | ICD-10-CM | POA: Diagnosis not present

## 2016-12-11 DIAGNOSIS — I2699 Other pulmonary embolism without acute cor pulmonale: Secondary | ICD-10-CM | POA: Diagnosis not present

## 2016-12-14 DIAGNOSIS — F419 Anxiety disorder, unspecified: Secondary | ICD-10-CM | POA: Diagnosis not present

## 2016-12-14 DIAGNOSIS — I10 Essential (primary) hypertension: Secondary | ICD-10-CM | POA: Diagnosis not present

## 2016-12-14 DIAGNOSIS — C712 Malignant neoplasm of temporal lobe: Secondary | ICD-10-CM | POA: Diagnosis not present

## 2016-12-14 DIAGNOSIS — J449 Chronic obstructive pulmonary disease, unspecified: Secondary | ICD-10-CM | POA: Diagnosis not present

## 2016-12-14 DIAGNOSIS — I2699 Other pulmonary embolism without acute cor pulmonale: Secondary | ICD-10-CM | POA: Diagnosis not present

## 2016-12-14 DIAGNOSIS — I495 Sick sinus syndrome: Secondary | ICD-10-CM | POA: Diagnosis not present

## 2016-12-16 DIAGNOSIS — C712 Malignant neoplasm of temporal lobe: Secondary | ICD-10-CM | POA: Diagnosis not present

## 2016-12-16 DIAGNOSIS — I2699 Other pulmonary embolism without acute cor pulmonale: Secondary | ICD-10-CM | POA: Diagnosis not present

## 2016-12-16 DIAGNOSIS — I495 Sick sinus syndrome: Secondary | ICD-10-CM | POA: Diagnosis not present

## 2016-12-16 DIAGNOSIS — I10 Essential (primary) hypertension: Secondary | ICD-10-CM | POA: Diagnosis not present

## 2016-12-16 DIAGNOSIS — F419 Anxiety disorder, unspecified: Secondary | ICD-10-CM | POA: Diagnosis not present

## 2016-12-16 DIAGNOSIS — J449 Chronic obstructive pulmonary disease, unspecified: Secondary | ICD-10-CM | POA: Diagnosis not present

## 2016-12-17 DIAGNOSIS — J449 Chronic obstructive pulmonary disease, unspecified: Secondary | ICD-10-CM | POA: Diagnosis not present

## 2016-12-17 DIAGNOSIS — I10 Essential (primary) hypertension: Secondary | ICD-10-CM | POA: Diagnosis not present

## 2016-12-17 DIAGNOSIS — C712 Malignant neoplasm of temporal lobe: Secondary | ICD-10-CM | POA: Diagnosis not present

## 2016-12-17 DIAGNOSIS — F419 Anxiety disorder, unspecified: Secondary | ICD-10-CM | POA: Diagnosis not present

## 2016-12-17 DIAGNOSIS — I495 Sick sinus syndrome: Secondary | ICD-10-CM | POA: Diagnosis not present

## 2016-12-17 DIAGNOSIS — I2699 Other pulmonary embolism without acute cor pulmonale: Secondary | ICD-10-CM | POA: Diagnosis not present

## 2016-12-22 DIAGNOSIS — J449 Chronic obstructive pulmonary disease, unspecified: Secondary | ICD-10-CM | POA: Diagnosis not present

## 2016-12-22 DIAGNOSIS — I495 Sick sinus syndrome: Secondary | ICD-10-CM | POA: Diagnosis not present

## 2016-12-22 DIAGNOSIS — I2699 Other pulmonary embolism without acute cor pulmonale: Secondary | ICD-10-CM | POA: Diagnosis not present

## 2016-12-22 DIAGNOSIS — F419 Anxiety disorder, unspecified: Secondary | ICD-10-CM | POA: Diagnosis not present

## 2016-12-22 DIAGNOSIS — C712 Malignant neoplasm of temporal lobe: Secondary | ICD-10-CM | POA: Diagnosis not present

## 2016-12-22 DIAGNOSIS — I10 Essential (primary) hypertension: Secondary | ICD-10-CM | POA: Diagnosis not present

## 2016-12-25 DIAGNOSIS — C712 Malignant neoplasm of temporal lobe: Secondary | ICD-10-CM | POA: Diagnosis not present

## 2016-12-25 DIAGNOSIS — I10 Essential (primary) hypertension: Secondary | ICD-10-CM | POA: Diagnosis not present

## 2016-12-25 DIAGNOSIS — F419 Anxiety disorder, unspecified: Secondary | ICD-10-CM | POA: Diagnosis not present

## 2016-12-25 DIAGNOSIS — I495 Sick sinus syndrome: Secondary | ICD-10-CM | POA: Diagnosis not present

## 2016-12-25 DIAGNOSIS — J449 Chronic obstructive pulmonary disease, unspecified: Secondary | ICD-10-CM | POA: Diagnosis not present

## 2016-12-25 DIAGNOSIS — I2699 Other pulmonary embolism without acute cor pulmonale: Secondary | ICD-10-CM | POA: Diagnosis not present

## 2016-12-26 DIAGNOSIS — F419 Anxiety disorder, unspecified: Secondary | ICD-10-CM | POA: Diagnosis not present

## 2016-12-26 DIAGNOSIS — I495 Sick sinus syndrome: Secondary | ICD-10-CM | POA: Diagnosis not present

## 2016-12-26 DIAGNOSIS — C712 Malignant neoplasm of temporal lobe: Secondary | ICD-10-CM | POA: Diagnosis not present

## 2016-12-26 DIAGNOSIS — J449 Chronic obstructive pulmonary disease, unspecified: Secondary | ICD-10-CM | POA: Diagnosis not present

## 2016-12-26 DIAGNOSIS — I10 Essential (primary) hypertension: Secondary | ICD-10-CM | POA: Diagnosis not present

## 2016-12-26 DIAGNOSIS — I2699 Other pulmonary embolism without acute cor pulmonale: Secondary | ICD-10-CM | POA: Diagnosis not present

## 2016-12-30 DIAGNOSIS — C712 Malignant neoplasm of temporal lobe: Secondary | ICD-10-CM | POA: Diagnosis not present

## 2016-12-30 DIAGNOSIS — F419 Anxiety disorder, unspecified: Secondary | ICD-10-CM | POA: Diagnosis not present

## 2016-12-30 DIAGNOSIS — I10 Essential (primary) hypertension: Secondary | ICD-10-CM | POA: Diagnosis not present

## 2016-12-30 DIAGNOSIS — I495 Sick sinus syndrome: Secondary | ICD-10-CM | POA: Diagnosis not present

## 2016-12-30 DIAGNOSIS — I2699 Other pulmonary embolism without acute cor pulmonale: Secondary | ICD-10-CM | POA: Diagnosis not present

## 2016-12-30 DIAGNOSIS — J449 Chronic obstructive pulmonary disease, unspecified: Secondary | ICD-10-CM | POA: Diagnosis not present

## 2017-01-01 DIAGNOSIS — I2699 Other pulmonary embolism without acute cor pulmonale: Secondary | ICD-10-CM | POA: Diagnosis not present

## 2017-01-01 DIAGNOSIS — J449 Chronic obstructive pulmonary disease, unspecified: Secondary | ICD-10-CM | POA: Diagnosis not present

## 2017-01-01 DIAGNOSIS — C712 Malignant neoplasm of temporal lobe: Secondary | ICD-10-CM | POA: Diagnosis not present

## 2017-01-01 DIAGNOSIS — F419 Anxiety disorder, unspecified: Secondary | ICD-10-CM | POA: Diagnosis not present

## 2017-01-01 DIAGNOSIS — I495 Sick sinus syndrome: Secondary | ICD-10-CM | POA: Diagnosis not present

## 2017-01-01 DIAGNOSIS — I10 Essential (primary) hypertension: Secondary | ICD-10-CM | POA: Diagnosis not present

## 2017-01-04 ENCOUNTER — Encounter: Payer: Self-pay | Admitting: Internal Medicine

## 2017-01-04 ENCOUNTER — Ambulatory Visit (INDEPENDENT_AMBULATORY_CARE_PROVIDER_SITE_OTHER): Admitting: Internal Medicine

## 2017-01-04 VITALS — BP 98/65 | HR 71 | Ht 74.0 in | Wt 137.6 lb

## 2017-01-04 DIAGNOSIS — I495 Sick sinus syndrome: Secondary | ICD-10-CM

## 2017-01-04 LAB — CUP PACEART INCLINIC DEVICE CHECK
Battery Voltage: 3.01 V
Brady Statistic RA Percent Paced: 35 %
Implantable Lead Implant Date: 20171122
Implantable Lead Implant Date: 20171122
Implantable Lead Location: 753860
Implantable Pulse Generator Implant Date: 20171122
Lead Channel Impedance Value: 450 Ohm
Lead Channel Impedance Value: 512.5 Ohm
Lead Channel Pacing Threshold Amplitude: 0.75 V
Lead Channel Pacing Threshold Pulse Width: 0.5 ms
Lead Channel Pacing Threshold Pulse Width: 0.5 ms
Lead Channel Sensing Intrinsic Amplitude: 12 mV
Lead Channel Setting Pacing Amplitude: 2 V
Lead Channel Setting Sensing Sensitivity: 2 mV
MDC IDC LEAD LOCATION: 753859
MDC IDC MSMT BATTERY REMAINING LONGEVITY: 127 mo
MDC IDC MSMT LEADCHNL RA PACING THRESHOLD AMPLITUDE: 0.5 V
MDC IDC MSMT LEADCHNL RA SENSING INTR AMPL: 1.9 mV
MDC IDC SESS DTM: 20180910155240
MDC IDC SET LEADCHNL RV PACING AMPLITUDE: 2.5 V
MDC IDC SET LEADCHNL RV PACING PULSEWIDTH: 0.5 ms
MDC IDC STAT BRADY RV PERCENT PACED: 0.01 %
Pulse Gen Serial Number: 3182714

## 2017-01-04 NOTE — Patient Instructions (Signed)
Medication Instructions:  Your physician recommends that you continue on your current medications as directed. Please refer to the Current Medication list given to you today.  Labwork: None ordered.  Testing/Procedures: None ordered.  Follow-Up: Your physician recommends that you schedule a follow-up appointment as needed with Dr. Taylor  Any Other Special Instructions Will Be Listed Below (If Applicable).     If you need a refill on your cardiac medications before your next appointment, please call your pharmacy.  

## 2017-01-04 NOTE — Progress Notes (Signed)
HPI Ronald May returns today for follow-up of sinus node dysfunction status post permanent pacemaker insertion. In the interim, he has been diagnosed with glioblastoma and undergone chemotherapy. He has completed his treatment and is now in hospice. He denies pain but gets tired easily. He is cachectic having lost 60 lbs in the past 9 months.  No Known Allergies   Current Outpatient Prescriptions  Medication Sig Dispense Refill  . alfuzosin (UROXATRAL) 10 MG 24 hr tablet Take 10 mg by mouth daily with breakfast.    . calcium-vitamin D (OSCAL WITH D) 500-200 MG-UNIT per tablet Take 1 tablet by mouth 2 (two) times daily.     . naproxen sodium (ANAPROX) 220 MG tablet Take 440 mg by mouth 2 (two) times daily as needed (for pain).    . Omega-3 Fatty Acids (FISH OIL) 1000 MG CAPS Take 1 capsule by mouth 3 (three) times daily.     . ranitidine (ZANTAC) 300 MG tablet Take 300 mg by mouth 2 (two) times daily.    . Rivaroxaban 15 & 20 MG TBPK Take by mouth.    . simvastatin (ZOCOR) 40 MG tablet Take 40 mg by mouth daily.     No current facility-administered medications for this visit.      Past Medical History:  Diagnosis Date  . Arthritis    "back, knees, hands; nothing I have to take RX for" (03/17/2016)  . BPH (benign prostatic hyperplasia)   . Chronic lower back pain    "q am when I get up; goes away w/exercise" (03/17/2016)  . GERD (gastroesophageal reflux disease)   . History of kidney stones   . History of prostatitis 10/2014   S/P prostate biopsy  . Hyperlipidemia   . Sinus bradycardia   . Symptomatic bradycardia    a. s/p St Jude PPM 02/2016.    ROS:   All systems reviewed and negative except as noted in the HPI.   Past Surgical History:  Procedure Laterality Date  . CYSTOSCOPY W/ STONE MANIPULATION  1989  . EP IMPLANTABLE DEVICE N/A 03/18/2016   Procedure: Pacemaker Implant;  Surgeon: Evans Lance, MD;  Location: [facility];  Service: Cardiovascular;   Laterality: N/A;  . PROSTATE BIOPSY  10/2014     Family History  Problem Relation Age of Onset  . Liver cancer Mother   . CAD Brother      Social History   Social History  . Marital status: Married    Spouse name: N/A  . Number of children: N/A  . Years of education: N/A   Occupational History  . Not on file.   Social History Main Topics  . Smoking status: Current Every Day Smoker    Packs/day: 1.00    Years: 58.00    Types: Cigarettes    Start date: 10/26/2014  . Smokeless tobacco: Former Systems developer    Types: Chew     Comment: "quit chewing in the 1960s"  . Alcohol use No  . Drug use: No  . Sexual activity: Not on file   Other Topics Concern  . Not on file   Social History Narrative   Pt lives with wife in [place].  Retired Charity fundraiser.        Ht 6\' 2"  (1.88 m)   Physical Exam:  Chronically very ill appearing NAD HEENT: Unremarkable, head is shaved. Neck:  Unable to assess JVD, no thyromegally Lymphatics:  No adenopathy Back:  No CVA tenderness Lungs:  Clear with no wheezes. HEART:  Regular rate rhythm, no murmurs, no rubs, no clicks Abd:  soft, positive bowel sounds, no organomegally, no rebound, no guarding Ext:  2 plus pulses, no edema, no cyanosis, no clubbing Skin:  No rashes no nodules Neuro:  CN II through XII intact, motor grossly intact  EKG - nsr  DEVICE  Normal device function.  See PaceArt for details.   Assess/Plan: 1. Sinus node dysfunction - he is s/p PPM. He is asymptomatic from this. 2. PPM - his St. Jude DDD PM is working normally. 3. Glioblastoma - he is at the end of his illness and is on hospice.   Mikle Bosworth.D.

## 2017-01-05 DIAGNOSIS — C712 Malignant neoplasm of temporal lobe: Secondary | ICD-10-CM | POA: Diagnosis not present

## 2017-01-05 DIAGNOSIS — I2699 Other pulmonary embolism without acute cor pulmonale: Secondary | ICD-10-CM | POA: Diagnosis not present

## 2017-01-05 DIAGNOSIS — I495 Sick sinus syndrome: Secondary | ICD-10-CM | POA: Diagnosis not present

## 2017-01-05 DIAGNOSIS — I10 Essential (primary) hypertension: Secondary | ICD-10-CM | POA: Diagnosis not present

## 2017-01-05 DIAGNOSIS — J449 Chronic obstructive pulmonary disease, unspecified: Secondary | ICD-10-CM | POA: Diagnosis not present

## 2017-01-05 DIAGNOSIS — F419 Anxiety disorder, unspecified: Secondary | ICD-10-CM | POA: Diagnosis not present

## 2017-01-07 DIAGNOSIS — C712 Malignant neoplasm of temporal lobe: Secondary | ICD-10-CM | POA: Diagnosis not present

## 2017-01-07 DIAGNOSIS — F419 Anxiety disorder, unspecified: Secondary | ICD-10-CM | POA: Diagnosis not present

## 2017-01-07 DIAGNOSIS — I495 Sick sinus syndrome: Secondary | ICD-10-CM | POA: Diagnosis not present

## 2017-01-07 DIAGNOSIS — I10 Essential (primary) hypertension: Secondary | ICD-10-CM | POA: Diagnosis not present

## 2017-01-07 DIAGNOSIS — I2699 Other pulmonary embolism without acute cor pulmonale: Secondary | ICD-10-CM | POA: Diagnosis not present

## 2017-01-07 DIAGNOSIS — J449 Chronic obstructive pulmonary disease, unspecified: Secondary | ICD-10-CM | POA: Diagnosis not present

## 2017-01-11 DIAGNOSIS — F419 Anxiety disorder, unspecified: Secondary | ICD-10-CM | POA: Diagnosis not present

## 2017-01-11 DIAGNOSIS — I10 Essential (primary) hypertension: Secondary | ICD-10-CM | POA: Diagnosis not present

## 2017-01-11 DIAGNOSIS — C712 Malignant neoplasm of temporal lobe: Secondary | ICD-10-CM | POA: Diagnosis not present

## 2017-01-11 DIAGNOSIS — I2699 Other pulmonary embolism without acute cor pulmonale: Secondary | ICD-10-CM | POA: Diagnosis not present

## 2017-01-11 DIAGNOSIS — I495 Sick sinus syndrome: Secondary | ICD-10-CM | POA: Diagnosis not present

## 2017-01-11 DIAGNOSIS — J449 Chronic obstructive pulmonary disease, unspecified: Secondary | ICD-10-CM | POA: Diagnosis not present

## 2017-01-13 DIAGNOSIS — F419 Anxiety disorder, unspecified: Secondary | ICD-10-CM | POA: Diagnosis not present

## 2017-01-13 DIAGNOSIS — C712 Malignant neoplasm of temporal lobe: Secondary | ICD-10-CM | POA: Diagnosis not present

## 2017-01-13 DIAGNOSIS — I495 Sick sinus syndrome: Secondary | ICD-10-CM | POA: Diagnosis not present

## 2017-01-13 DIAGNOSIS — I10 Essential (primary) hypertension: Secondary | ICD-10-CM | POA: Diagnosis not present

## 2017-01-13 DIAGNOSIS — J449 Chronic obstructive pulmonary disease, unspecified: Secondary | ICD-10-CM | POA: Diagnosis not present

## 2017-01-13 DIAGNOSIS — I2699 Other pulmonary embolism without acute cor pulmonale: Secondary | ICD-10-CM | POA: Diagnosis not present

## 2017-01-14 DIAGNOSIS — C712 Malignant neoplasm of temporal lobe: Secondary | ICD-10-CM | POA: Diagnosis not present

## 2017-01-14 DIAGNOSIS — I2699 Other pulmonary embolism without acute cor pulmonale: Secondary | ICD-10-CM | POA: Diagnosis not present

## 2017-01-14 DIAGNOSIS — I10 Essential (primary) hypertension: Secondary | ICD-10-CM | POA: Diagnosis not present

## 2017-01-14 DIAGNOSIS — F419 Anxiety disorder, unspecified: Secondary | ICD-10-CM | POA: Diagnosis not present

## 2017-01-14 DIAGNOSIS — J449 Chronic obstructive pulmonary disease, unspecified: Secondary | ICD-10-CM | POA: Diagnosis not present

## 2017-01-14 DIAGNOSIS — I495 Sick sinus syndrome: Secondary | ICD-10-CM | POA: Diagnosis not present

## 2017-01-19 DIAGNOSIS — I495 Sick sinus syndrome: Secondary | ICD-10-CM | POA: Diagnosis not present

## 2017-01-19 DIAGNOSIS — J449 Chronic obstructive pulmonary disease, unspecified: Secondary | ICD-10-CM | POA: Diagnosis not present

## 2017-01-19 DIAGNOSIS — I10 Essential (primary) hypertension: Secondary | ICD-10-CM | POA: Diagnosis not present

## 2017-01-19 DIAGNOSIS — I2699 Other pulmonary embolism without acute cor pulmonale: Secondary | ICD-10-CM | POA: Diagnosis not present

## 2017-01-19 DIAGNOSIS — F419 Anxiety disorder, unspecified: Secondary | ICD-10-CM | POA: Diagnosis not present

## 2017-01-19 DIAGNOSIS — C712 Malignant neoplasm of temporal lobe: Secondary | ICD-10-CM | POA: Diagnosis not present

## 2017-01-23 DIAGNOSIS — J449 Chronic obstructive pulmonary disease, unspecified: Secondary | ICD-10-CM | POA: Diagnosis not present

## 2017-01-23 DIAGNOSIS — I495 Sick sinus syndrome: Secondary | ICD-10-CM | POA: Diagnosis not present

## 2017-01-23 DIAGNOSIS — I10 Essential (primary) hypertension: Secondary | ICD-10-CM | POA: Diagnosis not present

## 2017-01-23 DIAGNOSIS — C712 Malignant neoplasm of temporal lobe: Secondary | ICD-10-CM | POA: Diagnosis not present

## 2017-01-23 DIAGNOSIS — F419 Anxiety disorder, unspecified: Secondary | ICD-10-CM | POA: Diagnosis not present

## 2017-01-23 DIAGNOSIS — I2699 Other pulmonary embolism without acute cor pulmonale: Secondary | ICD-10-CM | POA: Diagnosis not present

## 2017-01-25 DIAGNOSIS — C712 Malignant neoplasm of temporal lobe: Secondary | ICD-10-CM | POA: Diagnosis not present

## 2017-01-25 DIAGNOSIS — I10 Essential (primary) hypertension: Secondary | ICD-10-CM | POA: Diagnosis not present

## 2017-01-25 DIAGNOSIS — J449 Chronic obstructive pulmonary disease, unspecified: Secondary | ICD-10-CM | POA: Diagnosis not present

## 2017-01-25 DIAGNOSIS — I2699 Other pulmonary embolism without acute cor pulmonale: Secondary | ICD-10-CM | POA: Diagnosis not present

## 2017-01-25 DIAGNOSIS — F419 Anxiety disorder, unspecified: Secondary | ICD-10-CM | POA: Diagnosis not present

## 2017-01-25 DIAGNOSIS — I495 Sick sinus syndrome: Secondary | ICD-10-CM | POA: Diagnosis not present

## 2017-01-26 DIAGNOSIS — F419 Anxiety disorder, unspecified: Secondary | ICD-10-CM | POA: Diagnosis not present

## 2017-01-26 DIAGNOSIS — I2699 Other pulmonary embolism without acute cor pulmonale: Secondary | ICD-10-CM | POA: Diagnosis not present

## 2017-01-26 DIAGNOSIS — C712 Malignant neoplasm of temporal lobe: Secondary | ICD-10-CM | POA: Diagnosis not present

## 2017-01-26 DIAGNOSIS — I10 Essential (primary) hypertension: Secondary | ICD-10-CM | POA: Diagnosis not present

## 2017-01-26 DIAGNOSIS — J449 Chronic obstructive pulmonary disease, unspecified: Secondary | ICD-10-CM | POA: Diagnosis not present

## 2017-01-26 DIAGNOSIS — I495 Sick sinus syndrome: Secondary | ICD-10-CM | POA: Diagnosis not present

## 2017-01-27 DIAGNOSIS — I10 Essential (primary) hypertension: Secondary | ICD-10-CM | POA: Diagnosis not present

## 2017-01-27 DIAGNOSIS — J449 Chronic obstructive pulmonary disease, unspecified: Secondary | ICD-10-CM | POA: Diagnosis not present

## 2017-01-27 DIAGNOSIS — I495 Sick sinus syndrome: Secondary | ICD-10-CM | POA: Diagnosis not present

## 2017-01-27 DIAGNOSIS — I2699 Other pulmonary embolism without acute cor pulmonale: Secondary | ICD-10-CM | POA: Diagnosis not present

## 2017-01-27 DIAGNOSIS — F419 Anxiety disorder, unspecified: Secondary | ICD-10-CM | POA: Diagnosis not present

## 2017-01-27 DIAGNOSIS — C712 Malignant neoplasm of temporal lobe: Secondary | ICD-10-CM | POA: Diagnosis not present

## 2017-01-28 DIAGNOSIS — I10 Essential (primary) hypertension: Secondary | ICD-10-CM | POA: Diagnosis not present

## 2017-01-28 DIAGNOSIS — I495 Sick sinus syndrome: Secondary | ICD-10-CM | POA: Diagnosis not present

## 2017-01-28 DIAGNOSIS — J449 Chronic obstructive pulmonary disease, unspecified: Secondary | ICD-10-CM | POA: Diagnosis not present

## 2017-01-28 DIAGNOSIS — C712 Malignant neoplasm of temporal lobe: Secondary | ICD-10-CM | POA: Diagnosis not present

## 2017-01-28 DIAGNOSIS — F419 Anxiety disorder, unspecified: Secondary | ICD-10-CM | POA: Diagnosis not present

## 2017-01-28 DIAGNOSIS — I2699 Other pulmonary embolism without acute cor pulmonale: Secondary | ICD-10-CM | POA: Diagnosis not present

## 2017-01-29 DIAGNOSIS — J449 Chronic obstructive pulmonary disease, unspecified: Secondary | ICD-10-CM | POA: Diagnosis not present

## 2017-01-29 DIAGNOSIS — I495 Sick sinus syndrome: Secondary | ICD-10-CM | POA: Diagnosis not present

## 2017-01-29 DIAGNOSIS — C712 Malignant neoplasm of temporal lobe: Secondary | ICD-10-CM | POA: Diagnosis not present

## 2017-01-29 DIAGNOSIS — F419 Anxiety disorder, unspecified: Secondary | ICD-10-CM | POA: Diagnosis not present

## 2017-01-29 DIAGNOSIS — I10 Essential (primary) hypertension: Secondary | ICD-10-CM | POA: Diagnosis not present

## 2017-01-29 DIAGNOSIS — I2699 Other pulmonary embolism without acute cor pulmonale: Secondary | ICD-10-CM | POA: Diagnosis not present

## 2017-02-01 DIAGNOSIS — I495 Sick sinus syndrome: Secondary | ICD-10-CM | POA: Diagnosis not present

## 2017-02-01 DIAGNOSIS — C712 Malignant neoplasm of temporal lobe: Secondary | ICD-10-CM | POA: Diagnosis not present

## 2017-02-01 DIAGNOSIS — I2699 Other pulmonary embolism without acute cor pulmonale: Secondary | ICD-10-CM | POA: Diagnosis not present

## 2017-02-01 DIAGNOSIS — F419 Anxiety disorder, unspecified: Secondary | ICD-10-CM | POA: Diagnosis not present

## 2017-02-01 DIAGNOSIS — I10 Essential (primary) hypertension: Secondary | ICD-10-CM | POA: Diagnosis not present

## 2017-02-01 DIAGNOSIS — J449 Chronic obstructive pulmonary disease, unspecified: Secondary | ICD-10-CM | POA: Diagnosis not present

## 2017-02-15 ENCOUNTER — Encounter: Payer: 59 | Admitting: *Deleted

## 2017-02-15 ENCOUNTER — Telehealth: Payer: Self-pay | Admitting: Cardiology

## 2017-02-15 NOTE — Telephone Encounter (Signed)
Spoke with patient wife and she informed me that patient has passed away on Mar 01, 2017.

## 2017-02-25 DEATH — deceased

## 2017-08-26 IMAGING — DX DG CHEST 2V
2 series · 2 of 2 positions shown · non-contrast
Comparison: 03/16/2016; 11/10/2014

CLINICAL DATA: Post pacemaker implantation.

EXAM:
CHEST  2 VIEW

[chest pa]
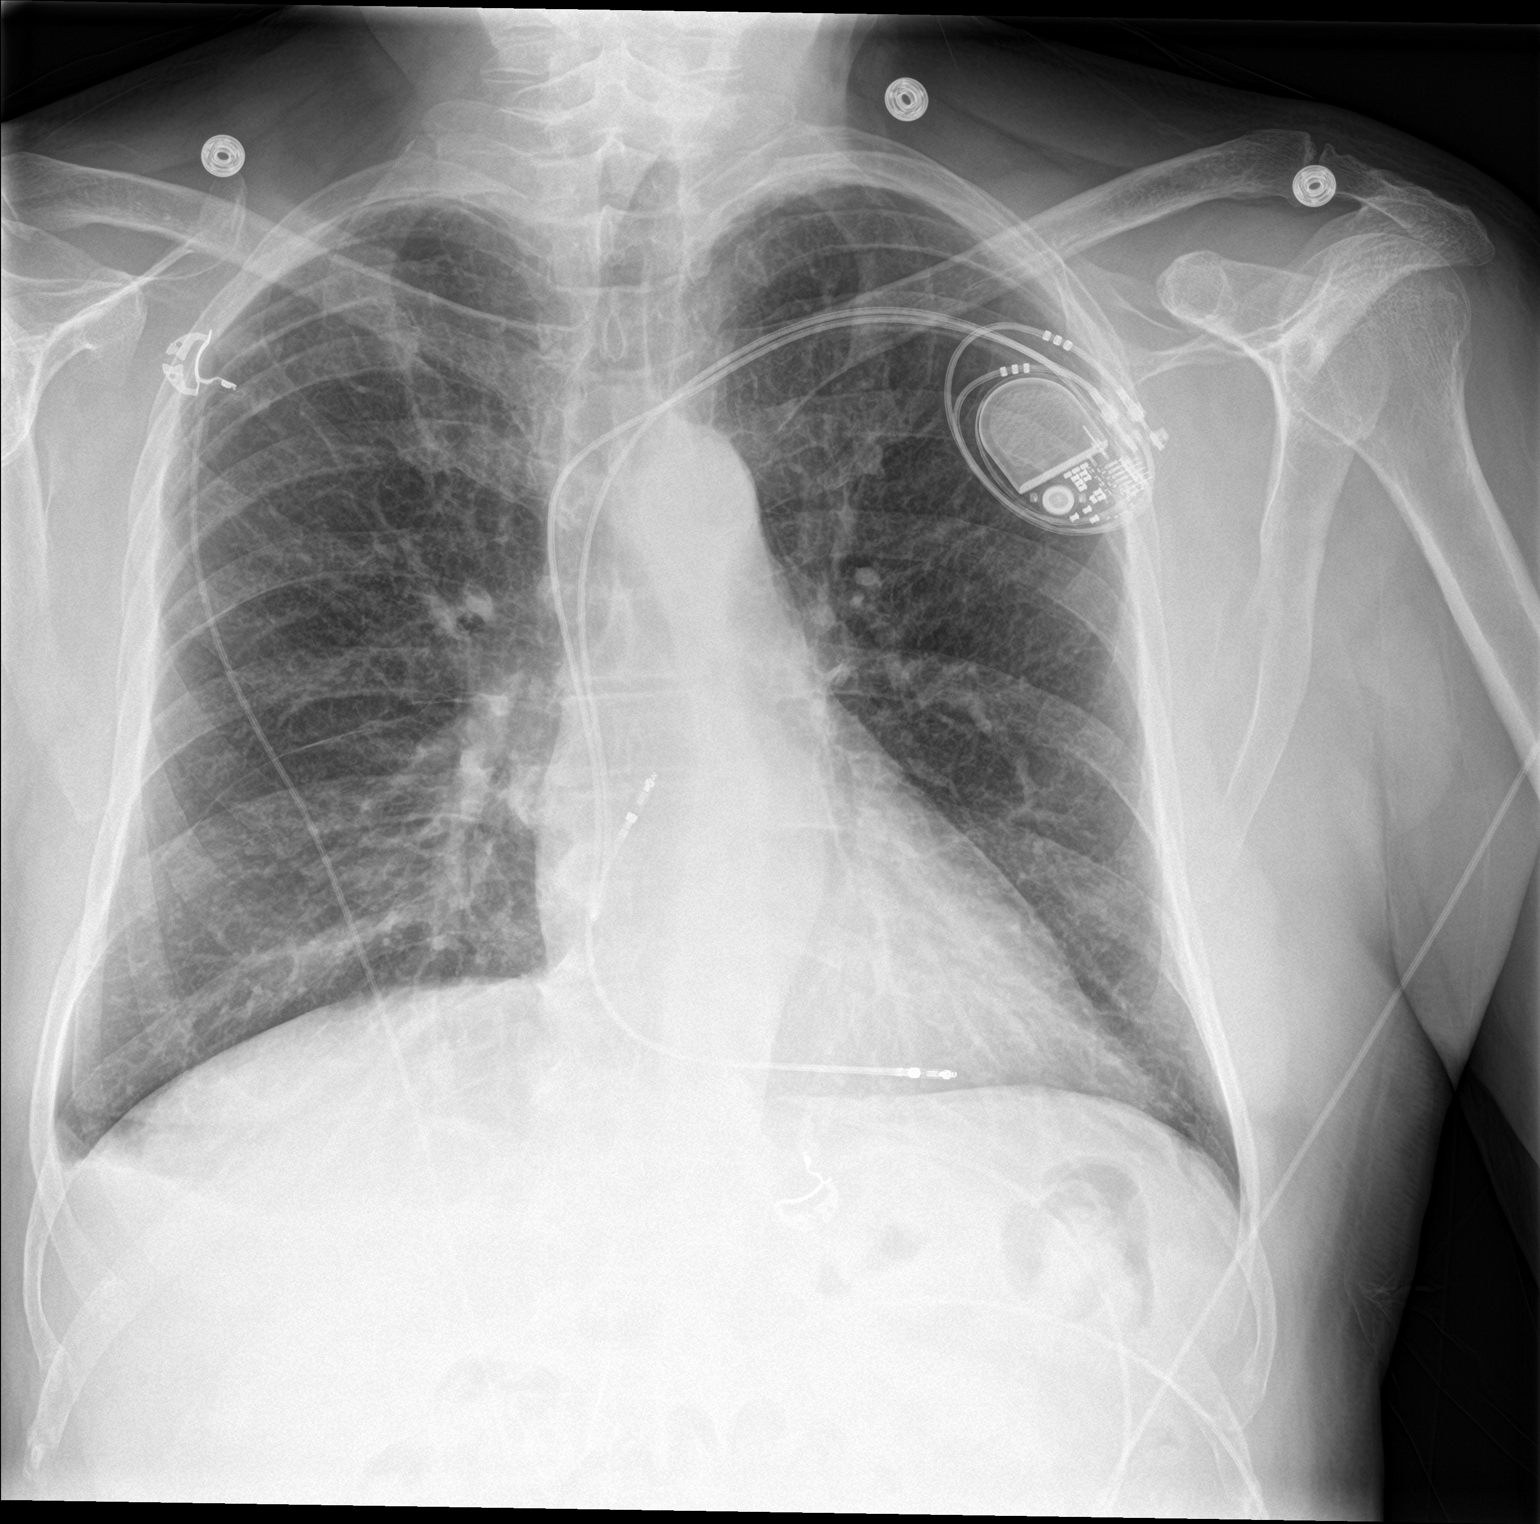

[chest lat]
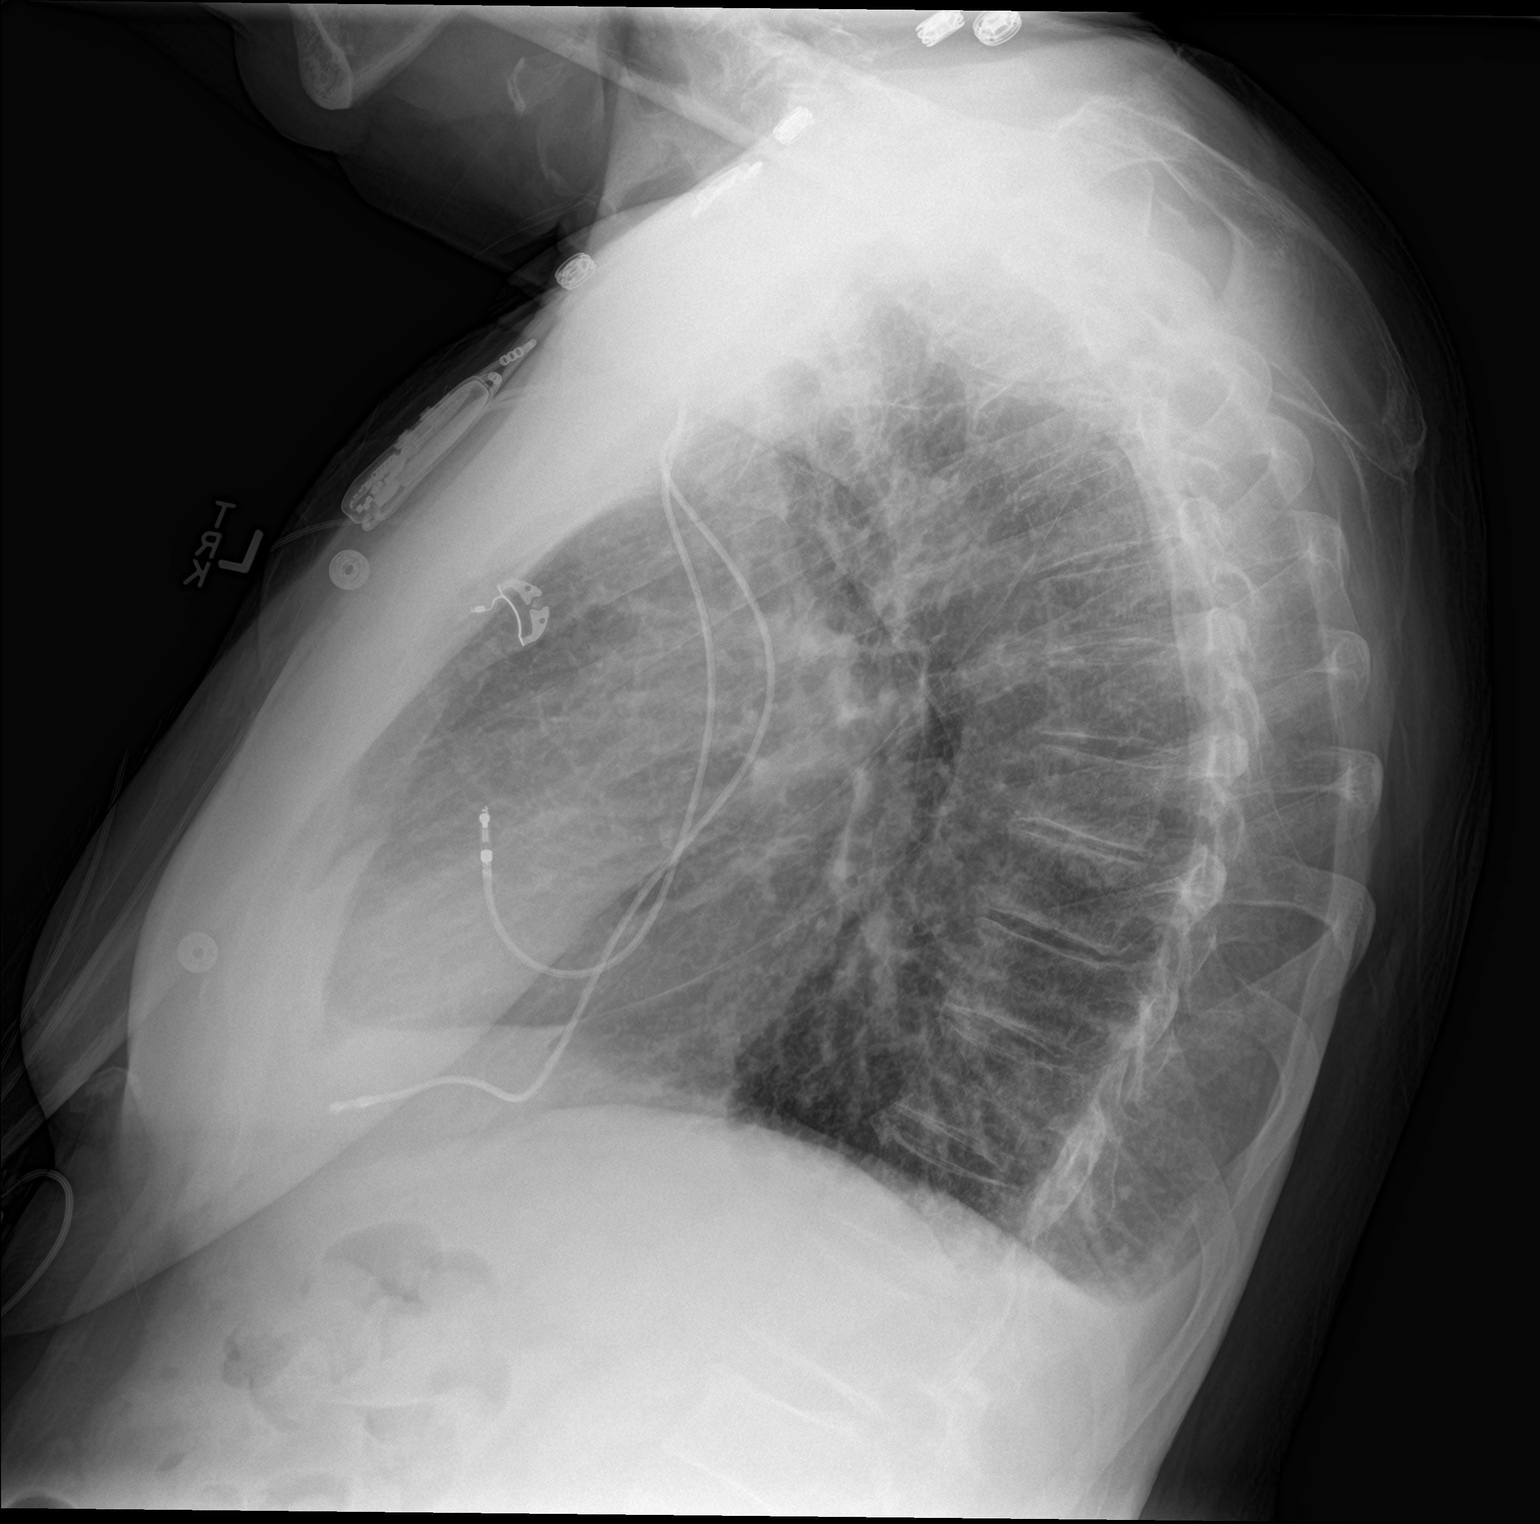

[2 of 2 positions shown; findings below may reference images not displayed]

FINDINGS: Grossly unchanged cardiac silhouette and mediastinal contours.
Interval placement of a left anterior chest wall dual lead pacemaker
with lead tips overlying expected location of the right atrium and
ventricle. Interval development of a trace right-sided pleural
effusion. Unchanged mild diffuse thickening of the pulmonary
interstitium. Minimal bilateral infrahilar opacities favored to
represent atelectasis. No new discrete focal airspace opacities. No
evidence of edema. No pneumothorax. No acute osseus abnormalities.
IMPRESSION: 1. Interval placement of left anterior chest wall dual lead
pacemaker without evidence of pneumothorax.
2. Interval development of small right-sided effusion without
evidence of edema.
